# Patient Record
Sex: Male | Born: 1975 | Race: White | Hispanic: No | Marital: Married | Smoking: Never smoker
Health system: Southern US, Community
[De-identification: ages and names within clinical notes are randomized; demographics above are authoritative.]

## PROBLEM LIST (undated history)

## (undated) DIAGNOSIS — F419 Anxiety disorder, unspecified: Secondary | ICD-10-CM

## (undated) DIAGNOSIS — T7840XA Allergy, unspecified, initial encounter: Secondary | ICD-10-CM

## (undated) DIAGNOSIS — L509 Urticaria, unspecified: Secondary | ICD-10-CM

## (undated) DIAGNOSIS — R51 Headache: Secondary | ICD-10-CM

## (undated) DIAGNOSIS — T783XXA Angioneurotic edema, initial encounter: Secondary | ICD-10-CM

## (undated) DIAGNOSIS — R519 Headache, unspecified: Secondary | ICD-10-CM

## (undated) DIAGNOSIS — R000 Tachycardia, unspecified: Secondary | ICD-10-CM

## (undated) DIAGNOSIS — K219 Gastro-esophageal reflux disease without esophagitis: Secondary | ICD-10-CM

## (undated) HISTORY — DX: Urticaria, unspecified: L50.9

## (undated) HISTORY — PX: TYMPANOSTOMY TUBE PLACEMENT: SHX32

## (undated) HISTORY — PX: ADENOIDECTOMY: SUR15

## (undated) HISTORY — DX: Allergy, unspecified, initial encounter: T78.40XA

## (undated) HISTORY — PX: HERNIA REPAIR: SHX51

## (undated) HISTORY — PX: TONSILLECTOMY: SUR1361

## (undated) HISTORY — DX: Angioneurotic edema, initial encounter: T78.3XXA

---

## 1998-08-18 ENCOUNTER — Encounter: Payer: Self-pay | Admitting: Emergency Medicine

## 1998-08-18 ENCOUNTER — Emergency Department (HOSPITAL_COMMUNITY): Admission: EM | Admit: 1998-08-18 | Discharge: 1998-08-18 | Payer: Self-pay | Admitting: Emergency Medicine

## 1999-02-21 ENCOUNTER — Encounter: Payer: Self-pay | Admitting: Emergency Medicine

## 1999-02-21 ENCOUNTER — Emergency Department (HOSPITAL_COMMUNITY): Admission: EM | Admit: 1999-02-21 | Discharge: 1999-02-21 | Payer: Self-pay | Admitting: Emergency Medicine

## 2007-05-18 ENCOUNTER — Emergency Department (HOSPITAL_COMMUNITY): Admission: EM | Admit: 2007-05-18 | Discharge: 2007-05-18 | Payer: Self-pay | Admitting: Family Medicine

## 2007-06-26 ENCOUNTER — Emergency Department (HOSPITAL_COMMUNITY): Admission: EM | Admit: 2007-06-26 | Discharge: 2007-06-26 | Payer: Self-pay | Admitting: *Deleted

## 2008-07-20 ENCOUNTER — Emergency Department (HOSPITAL_COMMUNITY): Admission: EM | Admit: 2008-07-20 | Discharge: 2008-07-20 | Payer: Self-pay | Admitting: Family Medicine

## 2008-07-23 ENCOUNTER — Emergency Department: Payer: Self-pay | Admitting: Unknown Physician Specialty

## 2014-11-13 HISTORY — PX: UMBILICAL HERNIA REPAIR: SHX196

## 2017-08-12 ENCOUNTER — Emergency Department
Admission: EM | Admit: 2017-08-12 | Discharge: 2017-08-12 | Disposition: A | Discharge status: Home / Self Care | Payer: BLUE CROSS/BLUE SHIELD | Attending: Emergency Medicine | Admitting: Emergency Medicine

## 2017-08-12 DIAGNOSIS — K047 Periapical abscess without sinus: Secondary | ICD-10-CM | POA: Insufficient documentation

## 2017-08-12 DIAGNOSIS — K029 Dental caries, unspecified: Secondary | ICD-10-CM

## 2017-08-12 DIAGNOSIS — K0889 Other specified disorders of teeth and supporting structures: Secondary | ICD-10-CM | POA: Diagnosis present

## 2017-08-12 MED ORDER — AMOXICILLIN 875 MG PO TABS: 875.0000 mg | ORAL_TABLET | Freq: Two times a day (BID) | ORAL | 0 refills | Status: DC

## 2017-08-12 MED ORDER — TRAMADOL HCL 50 MG PO TABS: 50.0000 mg | ORAL_TABLET | Freq: Four times a day (QID) | ORAL | 0 refills | Status: DC | PRN

## 2017-08-12 MED ORDER — TRAMADOL HCL 50 MG PO TABS
100.0000 mg | ORAL_TABLET | Freq: Once | ORAL | Status: AC
  Administered 2017-08-12: 100 mg via ORAL
  Filled 2017-08-12: qty 2

## 2017-08-12 MED ORDER — AMOXICILLIN 500 MG PO CAPS
1000.0000 mg | ORAL_CAPSULE | Freq: Once | ORAL | Status: AC
Start: 2017-08-12 — End: 2017-08-12
  Administered 2017-08-12: 1000 mg via ORAL
  Filled 2017-08-12: qty 2

## 2017-08-12 NOTE — ED Provider Notes (Signed)
[facility]-EMERGENCY DEPARTMENT Provider Note   CSN: [id number] Arrival date & time: 08/12/17  1841     History   Chief Complaint Chief Complaint  Patient presents with  . Dental Pain    HPI Jeffrey Francis is a 41 y.o. male presents to the emergency department for evaluation of right lower jaw pain 1 week. Patient states he has a bad tooth that she sat for several years. Over the last 24 hours, pain is become severe over his right lower third molar. Patient has been taking ibuprofen, BC powders with no improvement. She denies any fevers, headaches, vision changes, difficulty swallowing, drainage, trauma or injury.  HPI  No past medical history on file.  There are no active problems to display for this patient.   No past surgical history on file.     Home Medications    Prior to Admission medications   Medication Sig Start Date End Date Taking? Authorizing Provider  amoxicillin (AMOXIL) 875 MG tablet Take 1 tablet (875 mg total) by mouth 2 (two) times daily. X 10 days 08/12/17   Evon Slack, PA-C  traMADol (ULTRAM) 50 MG tablet Take 1 tablet (50 mg total) by mouth every 6 (six) hours as needed. 08/12/17   Evon Slack, PA-C    Family History No family history on file.  Social History Social History  Substance Use Topics  . Smoking status: Not on file  . Smokeless tobacco: Not on file  . Alcohol use Not on file     Allergies   Patient has no allergy information on record.   Review of Systems Review of Systems  Constitutional: Negative for fever.  HENT: Positive for dental problem. Negative for facial swelling, sore throat and trouble swallowing.   Respiratory: Negative for shortness of breath.   Cardiovascular: Negative for chest pain.  Gastrointestinal: Negative for nausea and vomiting.  Musculoskeletal: Negative for arthralgias.  Skin: Negative for wound.  Neurological: Negative for dizziness and headaches.     Physical Exam Updated Vital  Signs BP (!) 153/101 (BP Location: Left Arm)   Pulse 81   Temp 98.7 F (37.1 C) (Oral)   Resp 14   Ht  (1.753 m)   Wt 106.6 kg (235 lb)   SpO2 99%   BMI 34.70 kg/m   Physical Exam  Constitutional: He is oriented to person, place, and time. He appears well-developed and well-nourished. No distress.  HENT:  Head: Normocephalic and atraumatic.  Right Ear: External ear normal.  Left Ear: External ear normal.  Nose: Nose normal.  Mouth/Throat: Uvula is midline and oropharynx is clear and moist. No oral lesions. No trismus in the jaw. Normal dentition. Dental caries present. No dental abscesses or uvula swelling.    Eyes: EOM are normal.  Neck: Normal range of motion. Neck supple.  Cardiovascular: Normal rate.  Exam reveals no gallop and no friction rub.   No murmur heard. Pulmonary/Chest: Effort normal and breath sounds normal. No respiratory distress.  Neurological: He is alert and oriented to person, place, and time.  Skin: Skin is warm and dry.  Psychiatric: He has a normal mood and affect. His behavior is normal. Thought content normal.     ED Treatments / Results  Labs (all labs ordered are listed, but only abnormal results are displayed) Labs Reviewed - No data to display  EKG  EKG Interpretation None       Radiology No results found.  Procedures Procedures (including critical care time)  Medications  Ordered in ED Medications  amoxicillin (AMOXIL) capsule 1,000 mg (not administered)  traMADol (ULTRAM) tablet 100 mg (not administered)     Initial Impression / Assessment and Plan / ED Course  I have reviewed the triage vital signs and the nursing notes.  Pertinent labs & imaging results that were available during my care of the patient were reviewed by me and considered in my medical decision making (see chart for details).     42 year old with right lower third molar dental pain. He is started on antibiotics and given a prescription for tramadol.  He will use Tylenol and ibuprofen as needed. He is given information on dental clinics in the area. He is educated on signs and symptoms return to the ED for.  Final Clinical Impressions(s) / ED Diagnoses   Final diagnoses:  Dental caries  Dental infection    New Prescriptions New Prescriptions   AMOXICILLIN (AMOXIL) 875 MG TABLET    Take 1 tablet (875 mg total) by mouth 2 (two) times daily. X 10 days   TRAMADOL (ULTRAM) 50 MG TABLET    Take 1 tablet (50 mg total) by mouth every 6 (six) hours as needed.     Evon Slack, PA-C 08/12/17 2027    Arnaldo Natal, MD 08/13/17 [phone]

## 2017-08-12 NOTE — Discharge Instructions (Signed)
Please take antibiotics as prescribed. Alternate Tylenol and ibuprofen for mild to moderate pain. Take tramadol as needed for severe pain. Please follow up with dental clinic. Return to the emergency department for any fevers, increasing pain, worsening symptoms or changes in health.

## 2017-08-12 NOTE — ED Triage Notes (Signed)
Patient c/o right lower jaw pain for 1 week.

## 2017-09-03 ENCOUNTER — Emergency Department
Admission: EM | Admit: 2017-09-03 | Discharge: 2017-09-03 | Disposition: A | Discharge status: Home / Self Care | Payer: BLUE CROSS/BLUE SHIELD | Attending: Emergency Medicine | Admitting: Emergency Medicine

## 2017-09-03 ENCOUNTER — Encounter: Payer: Self-pay | Admitting: Emergency Medicine

## 2017-09-03 DIAGNOSIS — K0889 Other specified disorders of teeth and supporting structures: Secondary | ICD-10-CM | POA: Diagnosis not present

## 2017-09-03 MED ORDER — TRAMADOL HCL 50 MG PO TABS: 50.0000 mg | ORAL_TABLET | Freq: Four times a day (QID) | ORAL | 0 refills | Status: DC | PRN

## 2017-09-03 MED ORDER — AMOXICILLIN 500 MG PO TABS: 500.0000 mg | ORAL_TABLET | Freq: Two times a day (BID) | ORAL | 0 refills | Status: DC

## 2017-09-03 NOTE — ED Triage Notes (Signed)
Lower right dental pain over past month. Scheduled to have removed, did have course of antibiotics during past month which relieved pain but pain returned 2 day ago.

## 2017-09-03 NOTE — ED Notes (Signed)
Pt states right sided dental pain, states he has an appt with an oral surgeon in the next few weeks but pain is too bad, states he was recently given abx for same tooth and felt better but he took all abx, awake and alert in no acute distress

## 2017-09-03 NOTE — ED Provider Notes (Signed)
[facility] Emergency Department Provider Note   ____________________________________________    I have reviewed the triage vital signs and the nursing notes.   HISTORY  Chief Complaint Dental Pain     HPI Jeffrey Francis is a 41 y.o. male who presents with dental pain. Patient reports 24 hours of right lower wisdom tooth pain, he has had problems in this area before and has oral surgery appointment scheduled for Thursday. Has had relief in the past from antibiotics. No facial swelling. No difficulty swallowing   History reviewed. No pertinent past medical history.  There are no active problems to display for this patient.   History reviewed. No pertinent surgical history.  Prior to Admission medications   Medication Sig Start Date End Date Taking? Authorizing Provider  amoxicillin (AMOXIL) 500 MG tablet Take 1 tablet (500 mg total) by mouth 2 (two) times daily. X 10 days 09/03/17   Jene Every, MD  traMADol (ULTRAM) 50 MG tablet Take 1 tablet (50 mg total) by mouth every 6 (six) hours as needed. 09/03/17 09/03/18  Jene Every, MD     Allergies Patient has no known allergies.  No family history on file.  Social History Social History  Substance Use Topics  . Smoking status: Never Smoker  . Smokeless tobacco: Not on file  . Alcohol use Not on file    Review of Systems  Constitutional: No fever/chills  ENT: No sore throat. No difficulty swallowing, dental pain as above   Gastrointestinal:   No nausea, no vomiting.     Skin: Negative for rash. Neurological: Negative for headaches     ____________________________________________   PHYSICAL EXAM:  VITAL SIGNS: ED Triage Vitals  Enc Vitals Group     BP 09/03/17 1229 (!) 128/91     Pulse Rate 09/03/17 1229 96     Resp 09/03/17 1229 20     Temp 09/03/17 1229 98.3 F (36.8 C)     Temp Source 09/03/17 1229 Oral     SpO2 09/03/17 1229 97 %     Weight 09/03/17 1230  108.9 kg (240 lb)     Height 09/03/17 1230 1.753 m (5\' 9" )     Head Circumference --      Peak Flow --      Pain Score 09/03/17 1236 6     Pain Loc --      Pain Edu? --      Excl. in GC? --     Constitutional: Alert and oriented. No acute distress. Pleasant and interactive Eyes: Conjunctivae are normal.  Head: Atraumatic. Nose: No congestion/rhinnorhea. Mouth/Throat: Mucous membranes are moist.  , Right inferior wisdom tooth with mild erythema surrounding but no drainable abscess, normal pharynx, normal tongue   Neurologic:  Normal speech and language. No gross focal neurologic deficits are appreciated.   Skin:  Skin is warm, dry and intact. No rash noted.   ____________________________________________   LABS (all labs ordered are listed, but only abnormal results are displayed)  Labs Reviewed - No data to display ____________________________________________  EKG   ____________________________________________  RADIOLOGY  None ____________________________________________   PROCEDURES  Procedure(s) performed:No    Critical Care performed: No ____________________________________________   INITIAL IMPRESSION / ASSESSMENT AND PLAN / ED COURSE  Pertinent labs & imaging results that were available during my care of the patient were reviewed by me and considered in my medical decision making (see chart for details).  Patient with dental pain as described above, will Rx  antibiotics and pain medication, outpatient follow-up as described   ____________________________________________   FINAL CLINICAL IMPRESSION(S) / ED DIAGNOSES  Final diagnoses:  Tooth pain      NEW MEDICATIONS STARTED DURING THIS VISIT:  Discharge Medication List as of 09/03/2017  1:42 PM       Note:  This document was prepared using Dragon voice recognition software and may include unintentional dictation errors.    Jene EveryKinner, Woodrow Drab, MD 09/03/17 [phone]

## 2018-03-25 ENCOUNTER — Emergency Department
Admission: EM | Admit: 2018-03-25 | Discharge: 2018-03-25 | Disposition: A | Discharge status: Home / Self Care | Payer: BLUE CROSS/BLUE SHIELD | Attending: Emergency Medicine | Admitting: Emergency Medicine

## 2018-03-25 ENCOUNTER — Other Ambulatory Visit: Payer: Self-pay

## 2018-03-25 ENCOUNTER — Encounter: Payer: Self-pay | Admitting: Emergency Medicine

## 2018-03-25 DIAGNOSIS — R531 Weakness: Secondary | ICD-10-CM | POA: Insufficient documentation

## 2018-03-25 DIAGNOSIS — M791 Myalgia, unspecified site: Secondary | ICD-10-CM | POA: Diagnosis not present

## 2018-03-25 DIAGNOSIS — R000 Tachycardia, unspecified: Secondary | ICD-10-CM

## 2018-03-25 DIAGNOSIS — Y93E1 Activity, personal bathing and showering: Secondary | ICD-10-CM | POA: Diagnosis not present

## 2018-03-25 DIAGNOSIS — Y92002 Bathroom of unspecified non-institutional (private) residence single-family (private) house as the place of occurrence of the external cause: Secondary | ICD-10-CM | POA: Insufficient documentation

## 2018-03-25 DIAGNOSIS — W57XXXA Bitten or stung by nonvenomous insect and other nonvenomous arthropods, initial encounter: Secondary | ICD-10-CM | POA: Diagnosis not present

## 2018-03-25 DIAGNOSIS — S30860A Insect bite (nonvenomous) of lower back and pelvis, initial encounter: Secondary | ICD-10-CM | POA: Insufficient documentation

## 2018-03-25 DIAGNOSIS — Y999 Unspecified external cause status: Secondary | ICD-10-CM | POA: Insufficient documentation

## 2018-03-25 LAB — CK: CK TOTAL: 79 U/L (ref 49–397)

## 2018-03-25 LAB — COMPREHENSIVE METABOLIC PANEL
ALK PHOS: 111 U/L (ref 38–126)
ALT: 37 U/L (ref 17–63)
AST: 27 U/L (ref 15–41)
Albumin: 4.5 g/dL (ref 3.5–5.0)
Anion gap: 8 (ref 5–15)
BILIRUBIN TOTAL: 1.1 mg/dL (ref 0.3–1.2)
BUN: 16 mg/dL (ref 6–20)
CALCIUM: 9 mg/dL (ref 8.9–10.3)
CHLORIDE: 98 mmol/L — AB (ref 101–111)
CO2: 29 mmol/L (ref 22–32)
CREATININE: 0.92 mg/dL (ref 0.61–1.24)
Glucose, Bld: 116 mg/dL — ABNORMAL HIGH (ref 65–99)
Potassium: 4.1 mmol/L (ref 3.5–5.1)
Sodium: 135 mmol/L (ref 135–145)
Total Protein: 7.6 g/dL (ref 6.5–8.1)

## 2018-03-25 LAB — URINALYSIS, COMPLETE (UACMP) WITH MICROSCOPIC
Bacteria, UA: NONE SEEN
Bilirubin Urine: NEGATIVE
Glucose, UA: NEGATIVE mg/dL
HGB URINE DIPSTICK: NEGATIVE
Ketones, ur: 5 mg/dL — AB
Leukocytes, UA: NEGATIVE
Nitrite: NEGATIVE
PH: 6 (ref 5.0–8.0)
Protein, ur: NEGATIVE mg/dL
SPECIFIC GRAVITY, URINE: 1.02 (ref 1.005–1.030)
Squamous Epithelial / LPF: NONE SEEN (ref 0–5)
WBC UA: NONE SEEN WBC/hpf (ref 0–5)

## 2018-03-25 LAB — CBC
HEMATOCRIT: 47.4 % (ref 40.0–52.0)
Hemoglobin: 16.2 g/dL (ref 13.0–18.0)
MCH: 30.8 pg (ref 26.0–34.0)
MCHC: 34.3 g/dL (ref 32.0–36.0)
MCV: 89.9 fL (ref 80.0–100.0)
PLATELETS: 246 10*3/uL (ref 150–440)
RBC: 5.27 MIL/uL (ref 4.40–5.90)
RDW: 13.6 % (ref 11.5–14.5)
WBC: 11.9 10*3/uL — ABNORMAL HIGH (ref 3.8–10.6)

## 2018-03-25 LAB — TSH: TSH: 0.547 u[IU]/mL (ref 0.350–4.500)

## 2018-03-25 MED ORDER — DOXYCYCLINE HYCLATE 100 MG PO TABS
100.0000 mg | ORAL_TABLET | Freq: Once | ORAL | Status: AC
  Administered 2018-03-25: 100 mg via ORAL
  Filled 2018-03-25: qty 1

## 2018-03-25 MED ORDER — ACETAMINOPHEN 500 MG PO TABS
1000.0000 mg | ORAL_TABLET | Freq: Once | ORAL | Status: AC
  Administered 2018-03-25: 1000 mg via ORAL
  Filled 2018-03-25: qty 2

## 2018-03-25 MED ORDER — SODIUM CHLORIDE 0.9 % IV BOLUS
1000.0000 mL | Freq: Once | INTRAVENOUS | Status: AC
  Administered 2018-03-25: 1000 mL via INTRAVENOUS

## 2018-03-25 MED ORDER — DOXYCYCLINE HYCLATE 50 MG PO CAPS: 100.0000 mg | ORAL_CAPSULE | Freq: Two times a day (BID) | ORAL | 0 refills | Status: AC

## 2018-03-25 MED ORDER — KETOROLAC TROMETHAMINE 30 MG/ML IJ SOLN
30.0000 mg | Freq: Once | INTRAMUSCULAR | Status: AC
Start: 2018-03-25 — End: 2018-03-25
  Administered 2018-03-25: 30 mg via INTRAVENOUS
  Filled 2018-03-25: qty 1

## 2018-03-25 NOTE — ED Notes (Signed)
Pt arrived to ED for generalized lethargy and chest pain that started this morning. Pt reports "it just feels like I haven't slept in years." Pt denies syncope or SOB. Pt reports that he pulled a possible tick or bug off his right buttocks last night while in the shower but there is no mark on his skin or pain to that area at this time. Pt reports chest pain radiates down to his umbilical hernia area and pain began today. Pt unsure if he had fever/chills. Pt wife at bedside. VSS. Call bell within reach, will continue to monitor.

## 2018-03-25 NOTE — Discharge Instructions (Addendum)
Please drink plenty of fluids to stay well-hydrated and eat small regular healthy meals throughout the day.  Get plenty of rest.  Make an appointment to establish a primary care physician for follow-up care as well as general health maintenance.  Return to the emergency department if you develop severe pain, vomiting, fever, or any other symptoms concerning to you.

## 2018-03-25 NOTE — ED Notes (Signed)
Iv fluids infusing 3 liter.  md aware.  Heart rate still increased.  Family with pt.

## 2018-03-25 NOTE — ED Notes (Signed)
Urinal at bedside. Pt aware of need for sample.  Lights dimmed for comfort

## 2018-03-25 NOTE — ED Triage Notes (Signed)
Says today started having fatigue and aching all over.  Says he thinks there was a tick on him yesterday.  Then says he has a hernia that is out at times.  Then says he has pain in chest that radiates down to hernia in mid abdomen.

## 2018-03-25 NOTE — ED Provider Notes (Addendum)
[facility] Emergency Department Provider Note  ____________________________________________  Time seen: Approximately 1:39 PM  I have reviewed the triage vital signs and the nursing notes.   HISTORY  Chief Complaint Generalized Body Aches and Insect Bite    HPI Jeffrey Francis is a 42 y.o. male, otherwise healthy, presenting with generalized body aches.  The patient reports that yesterday he was in the shower and felt a lump on his right buttock, grabbed an insect which he did not identify, and flung it against the wall but he thinks he may have been a tick or spider.  He was doing well until this morning when he woke up with generalized body aches.  He has no focality.  He denies any severe headache, fevers or chills, dysuria.  He had a single episode of nausea and vomiting this morning which has resolved and he is now drinking Gatorade without any difficulty.  He has not had any abdominal pain, constipation or diarrhea.  No travel outside the [place].  No known sick contacts.  He has not tried anything for symptoms for  History reviewed. No pertinent past medical history.  There are no active problems to display for this patient.   Past Surgical History:  Procedure Laterality Date  . HERNIA REPAIR      Current Outpatient Rx  . Order #: [id number] Class: Print  . Order #: [id number] Class: Print    Allergies Patient has no known allergies.  No family history on file.  Social History Social History   Tobacco Use  . Smoking status: Never Smoker  . Smokeless tobacco: Never Used  Substance Use Topics  . Alcohol use: Yes  . Drug use: Not on file    Review of Systems Constitutional: No fever/chills.  No lightheadedness or syncope.  Positive general malaise and generalized body aches.  Positive possible tick or spider bite. Eyes: No visual changes.  No eye discharge. ENT: No sore throat. No congestion or rhinorrhea. Cardiovascular: Denies chest  pain. Denies palpitations. Respiratory: Denies shortness of breath.  No cough. Gastrointestinal: No abdominal pain.  One episode of nausea and vomiting, now resolved.  No diarrhea.  No constipation. Genitourinary: Negative for dysuria. Musculoskeletal: Negative for back pain. Skin: Negative for rash. Neurological: Negative for headaches. No focal numbness, tingling or weakness.     ____________________________________________   PHYSICAL EXAM:  VITAL SIGNS: ED Triage Vitals  Enc Vitals Group     BP 03/25/18 1159 132/84     Pulse Rate 03/25/18 1159 (!) 122     Resp 03/25/18 1159 16     Temp 03/25/18 1159 98 F (36.7 C)     Temp Source 03/25/18 1159 Oral     SpO2 03/25/18 1159 97 %     Weight 03/25/18 1200 240 lb (108.9 kg)     Height 03/25/18 1200  (1.727 m)     Head Circumference --      Peak Flow --      Pain Score 03/25/18 1159 5     Pain Loc --      Pain Edu? --      Excl. in GC? --     Constitutional: Alert and oriented.  Answers questions appropriately.  Patient is resting comfortably in the stretcher and is able to move around without any significant distress peer Eyes: Conjunctivae are normal.  EOMI. No scleral icterus. Head: Atraumatic. Nose: No congestion/rhinnorhea. Mouth/Throat: Mucous membranes are mildly dry.  Neck: No stridor.  Supple.  No  JVD.  No meningismus. Cardiovascular: Fast rate, regular rhythm. No murmurs, rubs or gallops.  Respiratory: Normal respiratory effort.  No accessory muscle use or retractions. Lungs CTAB.  No wheezes, rales or ronchi. Gastrointestinal: Overweight.  Soft, nontender and nondistended.  No guarding or rebound.  No peritoneal signs. Musculoskeletal: No LE edema. No ttp in the calves or palpable cords.  Negative Homan's sign. Neurologic:  A&Ox3.  Speech is clear.  Face and smile are symmetric.  EOMI.  Moves all extremities well. Skin:  Skin is warm, dry and intact. No rash noted.  I have examined the area of the right  buttock where he states he thinks he was bitten by an insect, and do not note any abnormalities. Psychiatric: Mood is depressed and affect is flat.}  ____________________________________________   LABS (all labs ordered are listed, but only abnormal results are displayed)  Labs Reviewed  CBC - Abnormal; Notable for the following components:      Result Value   WBC 11.9 (*)    All other components within normal limits  COMPREHENSIVE METABOLIC PANEL - Abnormal; Notable for the following components:   Chloride 98 (*)    Glucose, Bld 116 (*)    All other components within normal limits  CK  URINALYSIS, COMPLETE (UACMP) WITH MICROSCOPIC  TSH   ____________________________________________  EKG  ED ECG REPORT I, Rockne Menghini, the attending physician, personally viewed and interpreted this ECG.   Date: 03/25/2018  EKG Time: 1208  Rate: 118  Rhythm: sinus tachycardia  Axis: normal  Intervals:none  ST&T Change: no STEMI  ____________________________________________  RADIOLOGY  No results found.  ____________________________________________   PROCEDURES  Procedure(s) performed: None  Procedures  Critical Care performed: No ____________________________________________   INITIAL IMPRESSION / ASSESSMENT AND PLAN / ED COURSE  Pertinent labs & imaging results that were available during my care of the patient were reviewed by me and considered in my medical decision making (see chart for details).  42 y.o. male, otherwise healthy, presenting with 1 day of generalized body aches and malaise without focality, in the setting of possible tick or spider bite.  Overall, the patient does have a sinus tachycardia.  He reports a history of anxiety in healthcare settings but states he is not feeling particularly anxious at this time.  I do not see any evidence of ischemia or arrhythmia.  The patient does have mildly dry mucous membranes and is receiving intravenous fluids  for possible dehydration as a cause.  In addition, his discomfort may be driving his heart rate and Tylenol and Toradol have been ordered.  In addition I have added a TSH.  Will rule out UTI.  If the patient's work-up is reassuring in the emergency department, anticipate giving the patient doxycycline for possible tickborne illness.  The patient does not have a PCP but we will give him information for establishing care at the [facility].  Plan reevaluation for final disposition.  ----------------------------------------- 3:54 PM on 03/25/2018 -----------------------------------------  The patient's work-up in the emergency department has been reassuring and he is feeling significantly better at this time.  His urinalysis does not show infection, his TSH is normal, his electrolytes are also reassuring.  His CK is within normal limits.  He has normal hemoglobin and hematocrit.  His white blood cell count is 11.9, which is nonspecific.  At this time, the patient has improvement in his tachycardia but continues to have a heart rate of about 110 so we will give him an additional liter  of fluid for continued monitoring.  I anticipate he will be able to be discharged with close PMD follow-up.  Return precautions were discussed with the patient and his wife.  ----------------------------------------- 6:12 PM on 03/25/2018 -----------------------------------------  The patient states that his symptoms have completely resolved and he is feeling better.  He has received 3 L of intravenous fluids and his heart rate has improved from 1 22-1 05.  He reports that his baseline heart rate is between 105 and 112 and that he has never had this evaluated before.  I have encouraged him to establish primary care at the [facility] clinic, both for his symptoms today as well as his elevated heart rate and routine health maintenance.  At this time, the patient is stable for discharge.  Return precautions were  discussed. ____________________________________________  FINAL CLINICAL IMPRESSION(S) / ED DIAGNOSES  Final diagnoses:  Sinus tachycardia  Myalgia  Generalized weakness         NEW MEDICATIONS STARTED DURING THIS VISIT:  New Prescriptions   No medications on file      Rockne Menghini, MD 03/25/18 1345    Rockne Menghini, MD 03/25/18 1555    Rockne Menghini, MD 03/25/18 [phone]

## 2018-03-25 NOTE — ED Notes (Signed)
Resumed care from val rn.  Pt alert.  Family at bedside.  Iv fluids infusing.

## 2018-03-25 NOTE — ED Notes (Signed)
Pt reports he is starting to feel a little better. Pt denies pain but does still feel lethargic. VSS. Will continue to monitor.

## 2018-07-05 ENCOUNTER — Other Ambulatory Visit: Payer: Self-pay

## 2018-07-05 ENCOUNTER — Emergency Department
Admission: EM | Admit: 2018-07-05 | Discharge: 2018-07-05 | Disposition: A | Discharge status: Home / Self Care | Payer: BLUE CROSS/BLUE SHIELD | Attending: Emergency Medicine | Admitting: Emergency Medicine

## 2018-07-05 ENCOUNTER — Encounter: Payer: Self-pay | Admitting: Emergency Medicine

## 2018-07-05 DIAGNOSIS — R1033 Periumbilical pain: Secondary | ICD-10-CM | POA: Insufficient documentation

## 2018-07-05 MED ORDER — OXYCODONE-ACETAMINOPHEN 5-325 MG PO TABS: 1.0000 | ORAL_TABLET | Freq: Four times a day (QID) | ORAL | 0 refills | Status: DC | PRN

## 2018-07-05 NOTE — ED Triage Notes (Signed)
Patient states "I think I have another hernia".  States has a knot to LLQ for the past few months.  Had some pain yesterday, but today denies complaint.    Patient is AAOx3.  Skin warm and dry. NAD

## 2018-07-05 NOTE — ED Provider Notes (Signed)
[facility] Emergency Department Provider Note       Time seen: ----------------------------------------- 1:25 PM on 07/05/2018 -----------------------------------------   I have reviewed the triage vital signs and the nursing notes.  HISTORY   Chief Complaint Hernia    HPI Jeffrey Francis is a 42 y.o. male with a history of hernia repair who presents to the ED for possible hernia.  [place]  He does a fair amount of lifting on his job and felt some discomfort in his periumbilical area.  He denies any fevers, chills, vomiting area when he is at rest the pain seems to resolve.  History reviewed. No pertinent past medical history.  There are no active problems to display for this patient.   Past Surgical History:  Procedure Laterality Date  . HERNIA REPAIR      Allergies Patient has no known allergies.  Social History Social History   Tobacco Use  . Smoking status: Never Smoker  . Smokeless tobacco: Never Used  Substance Use Topics  . Alcohol use: Yes  . Drug use: Not on file   Review of Systems Constitutional: Negative for fever. Cardiovascular: Negative for chest pain. Respiratory: Negative for shortness of breath. Gastrointestinal: Positive for intermittent abdominal pain Musculoskeletal: Negative for back pain. Skin: Negative for rash. Neurological: Negative for headaches, focal weakness or numbness.  All systems negative/normal/unremarkable except as stated in the HPI  ____________________________________________   PHYSICAL EXAM:  VITAL SIGNS: ED Triage Vitals  Enc Vitals Group     BP 07/05/18 1258 (!) 151/90     Pulse Rate 07/05/18 1258 96     Resp 07/05/18 1258 20     Temp 07/05/18 1258 97.7 F (36.5 C)     Temp Source 07/05/18 1258 Oral     SpO2 07/05/18 1258 98 %     Weight 07/05/18 1259 240 lb (108.9 kg)     Height 07/05/18 1259 5\' 9"  (1.753 m)     Head Circumference  --      Peak Flow --      Pain Score 07/05/18 1303 0     Pain Loc --      Pain Edu? --      Excl. in GC? --    Constitutional: Alert and oriented. Well appearing and in no distress. Gastrointestinal: Previous periumbilical surgical scar.  No specific tenderness at this time, no bulge or herniation. Musculoskeletal: Nontender with normal range of motion in extremities. No lower extremity tenderness nor edema. Neurologic:  Normal speech and language. No gross focal neurologic deficits are appreciated.  Skin:  Skin is warm, dry and intact. No rash noted. Psychiatric: Mood and affect are normal. Speech and behavior are normal.  ____________________________________________  ED COURSE:  As part of my medical decision making, I reviewed the following data within the electronic MEDICAL RECORD NUMBER History obtained from family if available, nursing notes, old chart and ekg, as well as notes from prior ED visits. Patient presented for pain around his periumbilical hernia repair site, clinically he does not currently have herniation   Procedures ____________________________________________  DIFFERENTIAL DIAGNOSIS   Umbilical hernia, scar tissue  FINAL ASSESSMENT AND PLAN  Umbilical pain   Plan: The patient had presented for umbilical pain at a recent umbilical surgical site.  Possibly has affected his previous surgical site and I am concerned about the integrity of the mesh from his previous surgery but the pain is not persistent, there is  not obvious herniation now and he can follow-up with general surgery as an outpatient.   Ulice DashJohnathan E Williams, MD   Note: This note was generated in part or whole with voice recognition software. Voice recognition is usually quite accurate but there are transcription errors that can and very often do occur. I apologize for any typographical errors that were not detected and corrected.     Emily FilbertWilliams, Jonathan E, MD 07/05/18 1341

## 2018-07-05 NOTE — ED Notes (Signed)
See triage note  Presents with possible hernia  States he has felt a limp to abd  Over the past few months  Developed pain again yesterday  No swelling noted at present

## 2018-07-29 ENCOUNTER — Encounter: Payer: Self-pay | Admitting: Surgery

## 2018-07-29 ENCOUNTER — Ambulatory Visit (INDEPENDENT_AMBULATORY_CARE_PROVIDER_SITE_OTHER): Payer: BLUE CROSS/BLUE SHIELD | Admitting: Surgery

## 2018-07-29 VITALS — Ht 69.0 in | Wt 248.0 lb

## 2018-07-29 DIAGNOSIS — R1013 Epigastric pain: Secondary | ICD-10-CM | POA: Diagnosis not present

## 2018-07-29 NOTE — Patient Instructions (Signed)
We have scheduled you for a CT Scan of your Abdomen and Pelvis. This has been scheduled at 08/06/2018 at our [place] location. Please Check-in at 12:45 PM, 15 minutes prior to your scheduled appointment. If you need to reschedule your Scan, you may do so by calling [phone].  You will need to pick up a prep kit at least 24 hours in advance of your Scan: You may pick this up at the [place] department at [place] [facility], or [employer].  Bring a list of medications with you to your appointment and you may have nothing to eat or drink 4 hours prior to your CT Scan.

## 2018-07-31 ENCOUNTER — Encounter: Payer: Self-pay | Admitting: Surgery

## 2018-07-31 NOTE — Progress Notes (Signed)
Patient ID: Jeffrey Francis, male   DOB: 1976/11/10, 42 y.o.   MRN: [id number]  HPI Jeffrey Francis is a 42 y.o. male in consultation at the request of Dr. Mayford Knife for abdominal pain.  He reports that he started having abdominal pain for the last several weeks around the previous scar where she had an umbilical hernia repair.  Apparently this was performed in [place] and the patient or the family is unsure whether or not they used mesh.  Apparently the family stated that initially surgery was going to be laparoscopically and then ended up being more complex and required an open technique.  He describes the pain is intermittent sharp and similar to the pain that he experienced years ago when he had his first hernia.  Labs reviewed including a CBC and a CMP that were completely normal.  He is obese but he is able to perform more than 4 METS of activity without any shortness of breath or chest pain.  He seems to have increasing pain after he eats.  No nausea no vomiting no other symptoms  HPI  History reviewed. No pertinent past medical history.  Past Surgical History:  Procedure Laterality Date  . HERNIA REPAIR    . UMBILICAL HERNIA REPAIR  2016    History reviewed. No pertinent family history.  Social History Social History   Tobacco Use  . Smoking status: Never Smoker  . Smokeless tobacco: Never Used  Substance Use Topics  . Alcohol use: Yes  . Drug use: Never    No Known Allergies  Current Outpatient Medications  Medication Sig Dispense Refill  . Ascorbic Acid (VITAMIN C) 1000 MG tablet Take 1,000 mg by mouth daily.    Marland Kitchen loratadine (CLARITIN) 10 MG tablet Take 10 mg by mouth daily.    . Multiple Vitamins-Minerals (MULTIVITAMIN MEN PO) Take 1 tablet by mouth 1 day or 1 dose.     No current facility-administered medications for this visit.      Review of Systems Full ROS  was asked and was negative except for the information on the HPI  Physical Exam Height 5\' 9"  (1.753 m),  weight 248 lb (112.5 kg). CONSTITUTIONAL: NAD EYES: Pupils are equal, round, and reactive to light, Sclera are non-icteric. EARS, NOSE, MOUTH AND THROAT: The oropharynx is clear. The oral mucosa is pink and moist. Hearing is intact to voice. LYMPH NODES:  Lymph nodes in the neck are normal. RESPIRATORY:  Lungs are clear. There is normal respiratory effort, with equal breath sounds bilaterally, and without pathologic use of accessory muscles. CARDIOVASCULAR: Heart is regular without murmurs, gallops, or rubs. GI: The abdomen is soft, tender to palpation around the umbilical area.  I do not see any definitive hernia defects.. There are no palpable masses. There is no hepatosplenomegaly. There are normal bowel sounds in all quadrants. GU: Rectal deferred.   MUSCULOSKELETAL: Normal muscle strength and tone. No cyanosis or edema.   SKIN: Turgor is good and there are no pathologic skin lesions or ulcers. NEUROLOGIC: Motor and sensation is grossly normal. Cranial nerves are grossly intact. PSYCH:  Oriented to person, place and time. Affect is normal.  Data Reviewed  I have personally reviewed the patient's imaging, laboratory findings and medical records.    Assessment/Plan Abdominal pain in a patient with a previous umbilical hernia repair.  On physical exam I cannot palpate a true defect.  I am concerned about potentially having mesh issues or the pain being neuropathic from nerve irritation.  I will obtain a CT scan of the abdomen and pelvis to further evaluate his abdominal wall.  I will see him back in a couple weeks after the result is done.  No need for any urgent surgical intervention at this time. A copy of this report will be sent to the referring provider   Sterling Bigiego Pabon, MD FACS General Surgeon 07/31/2018, 9:12 AM

## 2018-08-05 ENCOUNTER — Telehealth: Payer: Self-pay | Admitting: Surgery

## 2018-08-05 NOTE — Telephone Encounter (Signed)
I have spoke with patient. I have advised patient to call his insurance to find out if [facility] in[facility]. When speaking with the rep she stated that all claims are proceed with our local [employer]. This would be based on if [facility] is in-network with the [employer]. [facility] is in-network with the local [employer].

## 2018-08-05 NOTE — Telephone Encounter (Signed)
Patient's spouse Britta Mccreedy(Barbara [phone]) called & was checking on the CT Abd with contrast scheduled for 08-06-18 @ 1:00. They are wanting to see if it is in  or out of network. Please call and advise.

## 2018-08-06 ENCOUNTER — Ambulatory Visit
Admission: RE | Admit: 2018-08-06 | Discharge: 2018-08-06 | Disposition: A | Discharge status: Home / Self Care | Payer: BLUE CROSS/BLUE SHIELD | Source: Ambulatory Visit | Attending: Surgery | Admitting: Surgery

## 2018-08-06 DIAGNOSIS — K8020 Calculus of gallbladder without cholecystitis without obstruction: Secondary | ICD-10-CM | POA: Insufficient documentation

## 2018-08-06 DIAGNOSIS — R1013 Epigastric pain: Secondary | ICD-10-CM | POA: Diagnosis present

## 2018-08-06 DIAGNOSIS — K439 Ventral hernia without obstruction or gangrene: Secondary | ICD-10-CM | POA: Diagnosis not present

## 2018-08-06 MED ORDER — IOPAMIDOL (ISOVUE-300) INJECTION 61%
100.0000 mL | Freq: Once | INTRAVENOUS | Status: AC | PRN
  Administered 2018-08-06: 100 mL via INTRAVENOUS

## 2018-08-07 ENCOUNTER — Ambulatory Visit (INDEPENDENT_AMBULATORY_CARE_PROVIDER_SITE_OTHER): Payer: BLUE CROSS/BLUE SHIELD | Admitting: Surgery

## 2018-08-07 ENCOUNTER — Encounter: Payer: Self-pay | Admitting: Surgery

## 2018-08-07 ENCOUNTER — Encounter: Payer: Self-pay | Admitting: *Deleted

## 2018-08-07 VITALS — BP 128/78 | HR 100 | Temp 97.7°F | Resp 14 | Ht 68.0 in | Wt 249.0 lb

## 2018-08-07 DIAGNOSIS — K432 Incisional hernia without obstruction or gangrene: Secondary | ICD-10-CM | POA: Diagnosis not present

## 2018-08-07 NOTE — H&P (View-Only) (Signed)
Outpatient Surgical Follow Up  08/07/2018  Jeffrey Francis is an 42 y.o. male.   Chief Complaint  Patient presents with  . Follow-up    ventral hernia and review CT scan results from 08-06-18    HPI: Jeffrey Francis is following up for abdominal pain and questionable hernia.  CT scan personally reviewed showing evidence of a recurrent ventral hernia.  There is some evidence of a small bowel.  There is no evidence of incarceration or strangulation.  There is no evidence of any other acute abnormalities.  There is no evidence of loss of domain. He needs to have intermittent pains around his mid abdomen that are sharp. He is able to perform more than 4 METS w/o SOB or c/p.  Past Medical History:  Diagnosis Date  . Allergy    sinus problems    Past Surgical History:  Procedure Laterality Date  . HERNIA REPAIR    . UMBILICAL HERNIA REPAIR  2016    History reviewed. No pertinent family history.  Social History:  reports that he has never smoked. He has never used smokeless tobacco. He reports that he drinks alcohol. He reports that he does not use drugs.  Allergies: No Known Allergies  Medications reviewed.    ROS Full ROS performed and is otherwise negative other than what is stated in HPI   BP 128/78   Pulse 100   Temp 97.7 F (36.5 C) (Skin)   Resp 14   Ht 5\' 8"  (1.727 m)   Wt 249 lb (112.9 kg)   SpO2 98%   BMI 37.86 kg/m   Physical Exam  Constitutional: He is oriented to person, place, and time. He appears well-developed and well-nourished. No distress.  HENT:  Head: Normocephalic.  Neck: Normal range of motion. Neck supple. No tracheal deviation present. No thyromegaly present.  Cardiovascular: Normal rate and regular rhythm.  No murmur heard. Pulmonary/Chest: Effort normal and breath sounds normal. No stridor. No respiratory distress.  Abdominal: Soft. Bowel sounds are normal. He exhibits no distension and no mass. There is no tenderness. There is no rebound and no  guarding. A hernia is present.  TTP , no peritonitis  Musculoskeletal: Normal range of motion. He exhibits no edema or deformity.  Neurological: He is alert and oriented to person, place, and time.  Skin: Skin is warm and dry. He is not diaphoretic.  Psychiatric: He has a normal mood and affect. His behavior is normal. Judgment and thought content normal.  Nursing note and vitals reviewed.      No results found for this or any previous visit (from the past 48 hour(s)). Ct Abdomen W Contrast  Result Date: 08/06/2018 CLINICAL DATA:  History of hernia repair. Epigastric abdominal pain. Palpable abnormality on left-sided abdomen for several months. EXAM: CT ABDOMEN WITH CONTRAST TECHNIQUE: Multidetector CT imaging of the abdomen was performed using the standard protocol following bolus administration of intravenous contrast. CONTRAST:  ISOVUE-300 IOPAMIDOL (ISOVUE-300) INJECTION 61% COMPARISON:  None. FINDINGS: Lower chest: Clear lung bases. Normal heart size without pericardial or pleural effusion. Hepatobiliary: Normal liver. Stone in the gallbladder neck measures 1.5 cm. No evidence of acute cholecystitis or biliary duct dilatation. Pancreas: Normal, without mass or ductal dilatation. Spleen: Normal in size, without focal abnormality. Adrenals/Urinary Tract: Normal adrenal glands. Normal kidneys, without hydronephrosis. Stomach/Bowel: Normal stomach, without wall thickening. Normal colon and terminal ileum. Small bowel is positioned within a lower abdominal ventral hernia including on 65/2. No findings to suggest obstruction or strangulation. Vascular/Lymphatic:  Normal caliber of the aorta and branch vessels. No retroperitoneal or retrocrural adenopathy. Other: No ascites.  No free intraperitoneal air. Musculoskeletal: Transitional S1 vertebral body. Moderate disc bulge at L3-4. IMPRESSION: 1. Ventral lower abdominal wall hernia containing fat and nonobstructive small bowel. 2. Cholelithiasis.  Electronically Signed   By: Jeronimo Greaves M.D.   On: 08/06/2018 16:22    Assessment/Plan: ReCurrent and symptomatic ventral hernia on a patient that is overweight.  Discussed with the patient in detail about his disease process and given his symptoms I definitely recommend repair.  I do think he is a good candidate for robotic repair.  Discussed with the patient detail about the procedure.  Risk benefit and possible complications including but not limited to: Bleeding, infection, recurrence, bowel injury, the need for further procedures and conversion to open.  He understands and wishes to proceed  Sterling Big, MD [facility] General Surgeon

## 2018-08-07 NOTE — Progress Notes (Signed)
Outpatient Surgical Follow Up  08/07/2018  Jeffrey Francis is an 41 y.o. male.   Chief Complaint  Patient presents with  . Follow-up    ventral hernia and review CT scan results from 08-06-18    HPI: Jeffrey Francis is following up for abdominal pain and questionable hernia.  CT scan personally reviewed showing evidence of a recurrent ventral hernia.  There is some evidence of a small bowel.  There is no evidence of incarceration or strangulation.  There is no evidence of any other acute abnormalities.  There is no evidence of loss of domain. He needs to have intermittent pains around his mid abdomen that are sharp. He is able to perform more than 4 METS w/o SOB or c/p.  Past Medical History:  Diagnosis Date  . Allergy    sinus problems    Past Surgical History:  Procedure Laterality Date  . HERNIA REPAIR    . UMBILICAL HERNIA REPAIR  2016    History reviewed. No pertinent family history.  Social History:  reports that he has never smoked. He has never used smokeless tobacco. He reports that he drinks alcohol. He reports that he does not use drugs.  Allergies: No Known Allergies  Medications reviewed.    ROS Full ROS performed and is otherwise negative other than what is stated in HPI   BP 128/78   Pulse 100   Temp 97.7 F (36.5 C) (Skin)   Resp 14   Ht 5' 8" (1.727 m)   Wt 249 lb (112.9 kg)   SpO2 98%   BMI 37.86 kg/m   Physical Exam  Constitutional: He is oriented to person, place, and time. He appears well-developed and well-nourished. No distress.  HENT:  Head: Normocephalic.  Neck: Normal range of motion. Neck supple. No tracheal deviation present. No thyromegaly present.  Cardiovascular: Normal rate and regular rhythm.  No murmur heard. Pulmonary/Chest: Effort normal and breath sounds normal. No stridor. No respiratory distress.  Abdominal: Soft. Bowel sounds are normal. He exhibits no distension and no mass. There is no tenderness. There is no rebound and no  guarding. A hernia is present.  TTP , no peritonitis  Musculoskeletal: Normal range of motion. He exhibits no edema or deformity.  Neurological: He is alert and oriented to person, place, and time.  Skin: Skin is warm and dry. He is not diaphoretic.  Psychiatric: He has a normal mood and affect. His behavior is normal. Judgment and thought content normal.  Nursing note and vitals reviewed.      No results found for this or any previous visit (from the past 48 hour(s)). Ct Abdomen W Contrast  Result Date: 08/06/2018 CLINICAL DATA:  History of hernia repair. Epigastric abdominal pain. Palpable abnormality on left-sided abdomen for several months. EXAM: CT ABDOMEN WITH CONTRAST TECHNIQUE: Multidetector CT imaging of the abdomen was performed using the standard protocol following bolus administration of intravenous contrast. CONTRAST:  100mL ISOVUE-300 IOPAMIDOL (ISOVUE-300) INJECTION 61% COMPARISON:  None. FINDINGS: Lower chest: Clear lung bases. Normal heart size without pericardial or pleural effusion. Hepatobiliary: Normal liver. Stone in the gallbladder neck measures 1.5 cm. No evidence of acute cholecystitis or biliary duct dilatation. Pancreas: Normal, without mass or ductal dilatation. Spleen: Normal in size, without focal abnormality. Adrenals/Urinary Tract: Normal adrenal glands. Normal kidneys, without hydronephrosis. Stomach/Bowel: Normal stomach, without wall thickening. Normal colon and terminal ileum. Small bowel is positioned within a lower abdominal ventral hernia including on 65/2. No findings to suggest obstruction or strangulation. Vascular/Lymphatic:   Normal caliber of the aorta and branch vessels. No retroperitoneal or retrocrural adenopathy. Other: No ascites.  No free intraperitoneal air. Musculoskeletal: Transitional S1 vertebral body. Moderate disc bulge at L3-4. IMPRESSION: 1. Ventral lower abdominal wall hernia containing fat and nonobstructive small bowel. 2. Cholelithiasis.  Electronically Signed   By: Kyle  Talbot M.D.   On: 08/06/2018 16:22    Assessment/Plan: ReCurrent and symptomatic ventral hernia on a patient that is overweight.  Discussed with the patient in detail about his disease process and given his symptoms I definitely recommend repair.  I do think he is a good candidate for robotic repair.  Discussed with the patient detail about the procedure.  Risk benefit and possible complications including but not limited to: Bleeding, infection, recurrence, bowel injury, the need for further procedures and conversion to open.  He understands and wishes to proceed  Jeffrey Pabon, MD FACS General Surgeon 

## 2018-08-07 NOTE — Progress Notes (Signed)
Patient's surgery has been scheduled for 08-13-18 at [facility] with Dr. Everlene Farrier.   The patient was instructed to call the office should he have further questions.

## 2018-08-07 NOTE — Patient Instructions (Addendum)
The patient is aware to call back for any questions or new concerns. Plan out of work 2 weeks and light duty for 2 additional weeks  Hernia, Adult A hernia is the bulging of an organ or tissue through a weak spot in the muscles of the abdomen (abdominal wall). Hernias develop most often near the navel or groin. There are many kinds of hernias. Common kinds include:  Femoral hernia. This kind of hernia develops under the groin in the upper thigh area.  Inguinal hernia. This kind of hernia develops in the groin or scrotum.  Umbilical hernia. This kind of hernia develops near the navel.  Hiatal hernia. This kind of hernia causes part of the stomach to be pushed up into the chest.  Incisional hernia. This kind of hernia bulges through a scar from an abdominal surgery.  What are the causes? This condition may be caused by:  Heavy lifting.  Coughing over a long period of time.  Straining to have a bowel movement.  An incision made during an abdominal surgery.  A birth defect (congenital defect).  Excess weight or obesity.  Smoking.  Poor nutrition.  Cystic fibrosis.  Excess fluid in the abdomen.  Undescended testicles.  What are the signs or symptoms? Symptoms of a hernia include:  A lump on the abdomen. This is the first sign of a hernia. The lump may become more obvious with standing, straining, or coughing. It may get bigger over time if it is not treated or if the condition causing it is not treated.  Pain. A hernia is usually painless, but it may become painful over time if treatment is delayed. The pain is usually dull and may get worse with standing or lifting heavy objects.  Sometimes a hernia gets tightly squeezed in the weak spot (strangulated) or stuck there (incarcerated) and causes additional symptoms. These symptoms may include:  Vomiting.  Nausea.  Constipation.  Irritability.  How is this diagnosed? A hernia may be diagnosed with:  A physical  exam. During the exam your health care provider may ask you to cough or to make a specific movement, because a hernia is usually more visible when you move.  Imaging tests. These can include: ? X-rays. ? Ultrasound. ? CT scan.  How is this treated? A hernia that is small and painless may not need to be treated. A hernia that is large or painful may be treated with surgery. Inguinal hernias may be treated with surgery to prevent incarceration or strangulation. Strangulated hernias are always treated with surgery, because lack of blood to the trapped organ or tissue can cause it to die. Surgery to treat a hernia involves pushing the bulge back into place and repairing the weak part of the abdomen. Follow these instructions at home:  Avoid straining.  Do not lift anything heavier than 10 lb (4.5 kg).  Lift with your leg muscles, not your back muscles. This helps avoid strain.  When coughing, try to cough gently.  Prevent constipation. Constipation leads to straining with bowel movements, which can make a hernia worse or cause a hernia repair to break down. You can prevent constipation by: ? Eating a high-fiber diet that includes plenty of fruits and vegetables. ? Drinking enough fluids to keep your urine clear or pale yellow. Aim to drink 6-8 glasses of water per day. ? Using a stool softener as directed by your health care provider.  Lose weight, if you are overweight.  Do not use any tobacco products, including  cigarettes, chewing tobacco, or electronic cigarettes. If you need help quitting, ask your health care provider.  Keep all follow-up visits as directed by your health care provider. This is important. Your health care provider may need to monitor your condition. Contact a health care provider if:  You have swelling, redness, and pain in the affected area.  Your bowel habits change. Get help right away if:  You have a fever.  You have abdominal pain that is getting  worse.  You feel nauseous or you vomit.  You cannot push the hernia back in place by gently pressing on it while you are lying down.  The hernia: ? Changes in shape or size. ? Is stuck outside the abdomen. ? Becomes discolored. ? Feels hard or tender. This information is not intended to replace advice given to you by your health care provider. Make sure you discuss any questions you have with your health care provider. Document Released: 10/30/2005 Document Revised: 03/29/2016 Document Reviewed: 09/09/2014 Elsevier Interactive Patient Education  2017 [employer].   We have seen you today in our office for your Ventral (Abdominal) Hernia. We recommend that you begin using the abdominal binder given to you today for comfort.  If the Hernia comes out, lie down and push the protruding part back in if possible. It is much easier to do this while lying down.  Please review the instructions below. If you develop any signs or symptoms of a strangulated hernia, please go to the Emergency Room Immediately. These symptoms are highlighted below under, "GET HELP RIGHT AWAY IF:"  If you have any questions or concerns, please call our office and speak with a nurse.    Ventral Hernia A ventral hernia is a bulge of tissue from inside the abdomen that pushes through a weak area of the muscles that form the front wall of the abdomen. The tissues inside the abdomen are inside a sac (peritoneum). These tissues include the small intestine, large intestine, and the fatty tissue that covers the intestines (omentum). Sometimes, the bulge that forms a hernia contains intestines. Other hernias contain only fat. Ventral hernias do not go away without surgical treatment. There are several types of ventral hernias. You may have:  A hernia at an incision site from previous abdominal surgery (incisional hernia).  A hernia just above the belly button (epigastric hernia), or at the belly button (umbilical hernia).  These types of hernias can develop from heavy lifting or straining.  A hernia that comes and goes (reducible hernia). It may be visible only when you lift or strain. This type of hernia can be pushed back into the abdomen (reduced).  A hernia that traps abdominal tissue inside the hernia (incarcerated hernia). This type of hernia does not reduce.  A hernia that cuts off blood flow to the tissues inside the hernia (strangulated hernia). The tissues can start to die if this happens. This is a very painful bulge that cannot be reduced. A strangulated hernia is a medical emergency.  What are the causes? This condition is caused by abdominal tissue putting pressure on an area of weakness in the abdominal muscles. What increases the risk? The following factors may make you more likely to develop this condition:  Being male.  Being 80 or older.  Being overweight or obese.  Having had previous abdominal surgery, especially if there was an infection after surgery.  Having had an injury to the abdominal wall.  Having had several pregnancies.  Having a buildup of  fluid inside the abdomen (ascites).  What are the signs or symptoms? The only symptom of a ventral hernia may be a painless bulge in the abdomen. A reducible hernia may be visible only when you strain, cough, or lift. Other symptoms may include:  Dull pain.  A feeling of pressure.  Signs and symptoms of a strangulated hernia may include:  Increasing pain.  Nausea and vomiting.  Pain when pressing on the hernia.  The skin over the hernia turning red or purple.  Constipation.  Blood in the stool (feces).  How is this diagnosed? This condition may be diagnosed based on:  Your symptoms.  Your medical history.  A physical exam. You may be asked to cough or strain while standing. These actions increase the pressure inside your abdomen and force the hernia through the opening in your muscles. Your health care provider  may try to reduce the hernia by pressing on it.  Imaging studies, such as an ultrasound or CT scan.  How is this treated? This condition is treated with surgery. If you have a strangulated hernia, surgery is done as soon as possible. If your hernia is small and not incarcerated, you may be asked to lose some weight before surgery. Follow these instructions at home:  Follow instructions from your health care provider about eating or drinking restrictions.  If you are overweight, your health care provider may recommend that you increase your activity level and eat a healthier diet.  Do not lift anything that is heavier than 10 lb (4.5 kg).  Return to your normal activities as told by your health care provider. Ask your health care provider what activities are safe for you. You may need to avoid activities that increase pressure on your hernia.  Take over-the-counter and prescription medicines only as told by your health care provider.  Keep all follow-up visits as told by your health care provider. This is important. Contact a health care provider if:  Your hernia gets larger.  Your hernia becomes painful. Get help right away if:  Your hernia becomes increasingly painful.  You have pain along with any of the following: ? Changes in skin color in the area of the hernia. ? Nausea. ? Vomiting. ? Fever. Summary  A ventral hernia is a bulge of tissue from inside the abdomen that pushes through a weak area of the muscles that form the front wall of the abdomen.  This condition is treated with surgery, which may be urgent depending on your hernia.  Do not lift anything that is heavier than 10 lb (4.5 kg), and follow activity instructions from your health care provider. This information is not intended to replace advice given to you by your health care provider. Make sure you discuss any questions you have with your health care provider. Document Released: 10/16/2012 Document Revised:  06/16/2016 Document Reviewed: 06/16/2016 Elsevier Interactive Patient Education  [employer].   We have seen you today in our office for your Ventral (Abdominal) Hernia. We recommend that you begin using the abdominal binder given to you today for comfort.  If the Hernia comes out, lie down and push the protruding part back in if possible. It is much easier to do this while lying down.  Please review the instructions below. If you develop any signs or symptoms of a strangulated hernia, please go to the Emergency Room Immediately. These symptoms are highlighted below under, "GET HELP RIGHT AWAY IF:"  If you have any questions or concerns, please  call our office and speak with a nurse.

## 2018-08-09 ENCOUNTER — Other Ambulatory Visit: Payer: Self-pay

## 2018-08-09 ENCOUNTER — Encounter
Admission: RE | Admit: 2018-08-09 | Discharge: 2018-08-09 | Disposition: A | Discharge status: Home / Self Care | Payer: BLUE CROSS/BLUE SHIELD | Source: Ambulatory Visit | Attending: Surgery | Admitting: Surgery

## 2018-08-09 HISTORY — DX: Gastro-esophageal reflux disease without esophagitis: K21.9

## 2018-08-09 HISTORY — DX: Anxiety disorder, unspecified: F41.9

## 2018-08-09 HISTORY — DX: Headache, unspecified: R51.9

## 2018-08-09 HISTORY — DX: Tachycardia, unspecified: R00.0

## 2018-08-09 HISTORY — DX: Headache: R51

## 2018-08-09 NOTE — Patient Instructions (Signed)
Your procedure is scheduled on: 08-13-18 TUESDAY Report to Same Day Surgery 2nd floor medical mall [facility] Entrance-take elevator on left to 2nd floor.  Check in with surgery information desk.) To find out your arrival time please call [phone] between 1PM - 3PM on 08-12-18 MONDAY  Remember: Instructions that are not followed completely may result in serious medical risk, up to and including death, or upon the discretion of your surgeon and anesthesiologist your surgery may need to be rescheduled.    _x___ 1. Do not eat food after midnight the night before your procedure. NO GUM OR CANDY AFTER MIDNIGHT.  You may drink clear liquids up to 2 hours before you are scheduled to arrive at the hospital for your procedure.  Do not drink clear liquids within 2 hours of your scheduled arrival to the hospital.  Clear liquids include  --Water or Apple juice without pulp  --Clear carbohydrate beverage such as ClearFast or Gatorade  --Black Coffee or Clear Tea (No milk, no creamers, do not add anything to the coffee or Tea   ____Ensure clear carbohydrate drink on the way to the hospital for bariatric patients  ____Ensure clear carbohydrate drink 3 hours before surgery for Dr Rutherford Nail patients if physician instructed.    __x__ 2. No Alcohol for 24 hours before or after surgery.   __x__3. No Smoking or e-cigarettes for 24 prior to surgery.  Do not use any chewable tobacco products for at least 6 hour prior to surgery   ____  4. Bring all medications with you on the day of surgery if instructed.    __x__ 5. Notify your doctor if there is any change in your medical condition     (cold, fever, infections).    x___6. On the morning of surgery brush your teeth with toothpaste and water.  You may rinse your mouth with mouth wash if you wish.  Do not swallow any toothpaste or mouthwash.   Do not wear jewelry, make-up, hairpins, clips or nail polish.  Do not wear lotions, powders, or perfumes. You  may wear deodorant.  Do not shave 48 hours prior to surgery. Men may shave face and neck.  Do not bring valuables to the hospital.    [facility] is not responsible for any belongings or valuables.               Contacts, dentures or bridgework may not be worn into surgery.  Leave your suitcase in the car. After surgery it may be brought to your room.  For patients admitted to the hospital, discharge time is determined by your treatment team.  _  Patients discharged the day of surgery will not be allowed to drive home.  You will need someone to drive you home and stay with you the night of your procedure.    Please read over the following fact sheets that you were given:   [facility] Preparing for Surgery   ____ Take anti-hypertensive listed below, cardiac, seizure, asthma, anti-reflux and psychiatric medicines. These include:  1. NONE  2.  3.  4.  5.  6.  ____Fleets enema or Magnesium Citrate as directed.   _x___ Use CHG Soap or sage wipes as directed on instruction sheet   ____ Use inhalers on the day of surgery and bring to hospital day of surgery  ____ Stop Metformin and Janumet 2 days prior to surgery.    ____ Take 1/2 of usual insulin dose the night before surgery and none on  the morning surgery.   ____ Follow recommendations from Cardiologist, Pulmonologist or PCP regarding stopping Aspirin, Coumadin, Plavix ,Eliquis, Effient, or Pradaxa, and Pletal.  X____Stop Anti-inflammatories such as Advil, Aleve, Ibuprofen, Motrin, Naproxen, Naprosyn, Goodies powders or aspirin products NOW-OK to take Tylenol   ____ Stop supplements until after surgery.     ____ Bring C-Pap to the hospital.

## 2018-08-12 ENCOUNTER — Encounter
Admission: RE | Admit: 2018-08-12 | Discharge: 2018-08-12 | Disposition: A | Discharge status: Home / Self Care | Payer: BLUE CROSS/BLUE SHIELD | Source: Ambulatory Visit | Attending: Surgery | Admitting: Surgery

## 2018-08-12 DIAGNOSIS — Z01810 Encounter for preprocedural cardiovascular examination: Secondary | ICD-10-CM | POA: Diagnosis present

## 2018-08-12 DIAGNOSIS — R000 Tachycardia, unspecified: Secondary | ICD-10-CM | POA: Insufficient documentation

## 2018-08-13 ENCOUNTER — Other Ambulatory Visit: Payer: Self-pay

## 2018-08-13 ENCOUNTER — Encounter
Admission: RE | Disposition: A | Discharge status: Home / Self Care | Payer: Self-pay | Source: Ambulatory Visit | Attending: Surgery

## 2018-08-13 ENCOUNTER — Ambulatory Visit: Payer: BLUE CROSS/BLUE SHIELD | Admitting: Anesthesiology

## 2018-08-13 ENCOUNTER — Ambulatory Visit
Admission: RE | Admit: 2018-08-13 | Discharge: 2018-08-13 | Disposition: A | Discharge status: Home / Self Care | Payer: BLUE CROSS/BLUE SHIELD | Source: Ambulatory Visit | Attending: Surgery | Admitting: Surgery

## 2018-08-13 DIAGNOSIS — K432 Incisional hernia without obstruction or gangrene: Secondary | ICD-10-CM

## 2018-08-13 DIAGNOSIS — Z6837 Body mass index (BMI) 37.0-37.9, adult: Secondary | ICD-10-CM | POA: Diagnosis not present

## 2018-08-13 DIAGNOSIS — E663 Overweight: Secondary | ICD-10-CM | POA: Diagnosis not present

## 2018-08-13 HISTORY — PX: ROBOTIC ASSISTED LAPAROSCOPIC VENTRAL/INCISIONAL HERNIA REPAIR: SHX6607

## 2018-08-13 SURGERY — ROBOTIC ASSISTED LAPAROSCOPIC VENTRAL/INCISIONAL HERNIA REPAIR
Anesthesia: General

## 2018-08-13 MED ORDER — CELECOXIB 200 MG PO CAPS
ORAL_CAPSULE | ORAL | Status: AC
  Filled 2018-08-13: qty 1

## 2018-08-13 MED ORDER — FENTANYL CITRATE (PF) 100 MCG/2ML IJ SOLN
INTRAMUSCULAR | Status: DC | PRN
  Administered 2018-08-13: 50 ug via INTRAVENOUS
  Administered 2018-08-13: 25 ug via INTRAVENOUS
  Administered 2018-08-13 (×2): 50 ug via INTRAVENOUS
  Administered 2018-08-13: 25 ug via INTRAVENOUS

## 2018-08-13 MED ORDER — DIPHENHYDRAMINE HCL 50 MG/ML IJ SOLN
INTRAMUSCULAR | Status: DC | PRN
  Administered 2018-08-13: 12.5 mg via INTRAVENOUS

## 2018-08-13 MED ORDER — CEFAZOLIN SODIUM-DEXTROSE 2-4 GM/100ML-% IV SOLN
INTRAVENOUS | Status: AC
  Filled 2018-08-13: qty 100

## 2018-08-13 MED ORDER — FENTANYL CITRATE (PF) 250 MCG/5ML IJ SOLN
INTRAMUSCULAR | Status: AC
  Filled 2018-08-13: qty 5

## 2018-08-13 MED ORDER — CELECOXIB 200 MG PO CAPS
ORAL_CAPSULE | ORAL | Status: AC
  Administered 2018-08-13: 200 mg via ORAL
  Filled 2018-08-13: qty 1

## 2018-08-13 MED ORDER — LACTATED RINGERS IV SOLN
INTRAVENOUS | Status: DC
  Administered 2018-08-13: 11:00:00 via INTRAVENOUS

## 2018-08-13 MED ORDER — FENTANYL CITRATE (PF) 100 MCG/2ML IJ SOLN
25.0000 ug | INTRAMUSCULAR | Status: AC | PRN
  Administered 2018-08-13 (×6): 25 ug via INTRAVENOUS

## 2018-08-13 MED ORDER — PROPOFOL 10 MG/ML IV BOLUS
INTRAVENOUS | Status: DC | PRN
  Administered 2018-08-13: 200 mg via INTRAVENOUS

## 2018-08-13 MED ORDER — FENTANYL CITRATE (PF) 100 MCG/2ML IJ SOLN
INTRAMUSCULAR | Status: AC
  Administered 2018-08-13: 25 ug via INTRAVENOUS
  Filled 2018-08-13: qty 2

## 2018-08-13 MED ORDER — KETOROLAC TROMETHAMINE 30 MG/ML IJ SOLN
INTRAMUSCULAR | Status: AC
  Filled 2018-08-13: qty 1

## 2018-08-13 MED ORDER — LIDOCAINE HCL (PF) 2 % IJ SOLN
INTRAMUSCULAR | Status: AC
  Filled 2018-08-13: qty 10

## 2018-08-13 MED ORDER — SUGAMMADEX SODIUM 500 MG/5ML IV SOLN
INTRAVENOUS | Status: DC | PRN
  Administered 2018-08-13: 250 mg via INTRAVENOUS

## 2018-08-13 MED ORDER — OXYCODONE-ACETAMINOPHEN 5-325 MG PO TABS
1.0000 | ORAL_TABLET | Freq: Once | ORAL | Status: AC
  Administered 2018-08-13: 1 via ORAL

## 2018-08-13 MED ORDER — ACETAMINOPHEN 500 MG PO TABS
ORAL_TABLET | ORAL | Status: AC
  Administered 2018-08-13: 1000 mg via ORAL
  Filled 2018-08-13: qty 2

## 2018-08-13 MED ORDER — SODIUM CHLORIDE FLUSH 0.9 % IV SOLN
INTRAVENOUS | Status: AC
  Filled 2018-08-13: qty 10

## 2018-08-13 MED ORDER — SUCCINYLCHOLINE CHLORIDE 20 MG/ML IJ SOLN
INTRAMUSCULAR | Status: DC | PRN
  Administered 2018-08-13: 160 mg via INTRAVENOUS

## 2018-08-13 MED ORDER — CHLORHEXIDINE GLUCONATE CLOTH 2 % EX PADS: 6.0000 | MEDICATED_PAD | Freq: Once | CUTANEOUS | Status: DC

## 2018-08-13 MED ORDER — ROCURONIUM BROMIDE 50 MG/5ML IV SOLN
INTRAVENOUS | Status: AC
  Filled 2018-08-13: qty 1

## 2018-08-13 MED ORDER — ONDANSETRON HCL 4 MG/2ML IJ SOLN: 4.0000 mg | Freq: Once | INTRAMUSCULAR | Status: DC | PRN

## 2018-08-13 MED ORDER — SUGAMMADEX SODIUM 500 MG/5ML IV SOLN
INTRAVENOUS | Status: AC
  Filled 2018-08-13: qty 5

## 2018-08-13 MED ORDER — DIPHENHYDRAMINE HCL 50 MG/ML IJ SOLN
INTRAMUSCULAR | Status: AC
  Filled 2018-08-13: qty 1

## 2018-08-13 MED ORDER — OXYCODONE-ACETAMINOPHEN 5-325 MG PO TABS
ORAL_TABLET | ORAL | Status: AC
  Filled 2018-08-13: qty 1

## 2018-08-13 MED ORDER — MIDAZOLAM HCL 2 MG/2ML IJ SOLN
INTRAMUSCULAR | Status: DC | PRN
  Administered 2018-08-13: 2 mg via INTRAVENOUS

## 2018-08-13 MED ORDER — HYDROCODONE-ACETAMINOPHEN 5-325 MG PO TABS: 1.0000 | ORAL_TABLET | ORAL | 0 refills | Status: DC | PRN

## 2018-08-13 MED ORDER — MIDAZOLAM HCL 2 MG/2ML IJ SOLN
INTRAMUSCULAR | Status: AC
  Filled 2018-08-13: qty 2

## 2018-08-13 MED ORDER — GABAPENTIN 300 MG PO CAPS
300.0000 mg | ORAL_CAPSULE | ORAL | Status: AC
  Administered 2018-08-13: 300 mg via ORAL

## 2018-08-13 MED ORDER — CEFAZOLIN SODIUM-DEXTROSE 2-4 GM/100ML-% IV SOLN
2.0000 g | INTRAVENOUS | Status: AC
  Administered 2018-08-13: 2 g via INTRAVENOUS

## 2018-08-13 MED ORDER — CELECOXIB 200 MG PO CAPS
200.0000 mg | ORAL_CAPSULE | ORAL | Status: AC
  Administered 2018-08-13: 200 mg via ORAL

## 2018-08-13 MED ORDER — DEXAMETHASONE SODIUM PHOSPHATE 10 MG/ML IJ SOLN
INTRAMUSCULAR | Status: DC | PRN
  Administered 2018-08-13: 8 mg via INTRAVENOUS

## 2018-08-13 MED ORDER — FAMOTIDINE 20 MG PO TABS
ORAL_TABLET | ORAL | Status: AC
  Administered 2018-08-13: 20 mg via ORAL
  Filled 2018-08-13: qty 1

## 2018-08-13 MED ORDER — ACETAMINOPHEN 500 MG PO TABS
1000.0000 mg | ORAL_TABLET | ORAL | Status: AC
  Administered 2018-08-13: 1000 mg via ORAL

## 2018-08-13 MED ORDER — PROPOFOL 10 MG/ML IV BOLUS
INTRAVENOUS | Status: AC
  Filled 2018-08-13: qty 20

## 2018-08-13 MED ORDER — PHENYLEPHRINE HCL 10 MG/ML IJ SOLN
INTRAMUSCULAR | Status: DC | PRN
  Administered 2018-08-13: 100 ug via INTRAVENOUS
  Administered 2018-08-13: 50 ug via INTRAVENOUS
  Administered 2018-08-13: 150 ug via INTRAVENOUS
  Administered 2018-08-13: 100 ug via INTRAVENOUS

## 2018-08-13 MED ORDER — DEXMEDETOMIDINE HCL 200 MCG/2ML IV SOLN
INTRAVENOUS | Status: DC | PRN
  Administered 2018-08-13: 16 ug via INTRAVENOUS

## 2018-08-13 MED ORDER — FAMOTIDINE 20 MG PO TABS
20.0000 mg | ORAL_TABLET | Freq: Once | ORAL | Status: AC
  Administered 2018-08-13: 20 mg via ORAL

## 2018-08-13 MED ORDER — BUPIVACAINE-EPINEPHRINE (PF) 0.25% -1:200000 IJ SOLN
INTRAMUSCULAR | Status: AC
  Filled 2018-08-13: qty 30

## 2018-08-13 MED ORDER — DEXAMETHASONE SODIUM PHOSPHATE 10 MG/ML IJ SOLN
INTRAMUSCULAR | Status: AC
  Filled 2018-08-13: qty 1

## 2018-08-13 MED ORDER — ROCURONIUM BROMIDE 100 MG/10ML IV SOLN
INTRAVENOUS | Status: DC | PRN
  Administered 2018-08-13: 10 mg via INTRAVENOUS
  Administered 2018-08-13: 35 mg via INTRAVENOUS
  Administered 2018-08-13: 5 mg via INTRAVENOUS
  Administered 2018-08-13: 20 mg via INTRAVENOUS

## 2018-08-13 MED ORDER — ONDANSETRON HCL 4 MG/2ML IJ SOLN
INTRAMUSCULAR | Status: DC | PRN
  Administered 2018-08-13: 4 mg via INTRAVENOUS

## 2018-08-13 MED ORDER — ONDANSETRON HCL 4 MG/2ML IJ SOLN
INTRAMUSCULAR | Status: AC
  Filled 2018-08-13: qty 2

## 2018-08-13 MED ORDER — SUCCINYLCHOLINE CHLORIDE 20 MG/ML IJ SOLN
INTRAMUSCULAR | Status: AC
  Filled 2018-08-13: qty 1

## 2018-08-13 MED ORDER — ACETAMINOPHEN 10 MG/ML IV SOLN
INTRAVENOUS | Status: AC
  Filled 2018-08-13: qty 100

## 2018-08-13 MED ORDER — GLYCOPYRROLATE 0.2 MG/ML IJ SOLN
INTRAMUSCULAR | Status: DC | PRN
  Administered 2018-08-13: 0.1 mg via INTRAVENOUS

## 2018-08-13 MED ORDER — BUPIVACAINE-EPINEPHRINE 0.25% -1:200000 IJ SOLN
INTRAMUSCULAR | Status: DC | PRN
  Administered 2018-08-13: 30 mL

## 2018-08-13 MED ORDER — LIDOCAINE HCL (CARDIAC) PF 100 MG/5ML IV SOSY
PREFILLED_SYRINGE | INTRAVENOUS | Status: DC | PRN
  Administered 2018-08-13: 80 mg via INTRAVENOUS

## 2018-08-13 MED ORDER — GABAPENTIN 300 MG PO CAPS
ORAL_CAPSULE | ORAL | Status: AC
  Administered 2018-08-13: 300 mg via ORAL
  Filled 2018-08-13: qty 1

## 2018-08-13 SURGICAL SUPPLY — 43 items
CANISTER SUCT 1200ML W/VALVE (MISCELLANEOUS) ×3 IMPLANT
CANNULA SEALS 8.5MM (CANNULA) ×2
CHLORAPREP W/TINT 26ML (MISCELLANEOUS) ×3 IMPLANT
CORD BIP STRL DISP 12FT (MISCELLANEOUS) ×3 IMPLANT
DEFOGGER SCOPE WARMER CLEARIFY (MISCELLANEOUS) ×3 IMPLANT
DERMABOND ADVANCED (GAUZE/BANDAGES/DRESSINGS) ×2
DERMABOND ADVANCED .7 DNX12 (GAUZE/BANDAGES/DRESSINGS) ×1 IMPLANT
DRAPE 3 ARM ACCESS DA VINCI (DRAPES) ×2
DRAPE 3 ARM ACCESS DVNC (DRAPES) ×1 IMPLANT
DRAPE SHEET LG 3/4 BI-LAMINATE (DRAPES) ×6 IMPLANT
ELECT REM PT RETURN 9FT ADLT (ELECTROSURGICAL) ×3
ELECTRODE REM PT RTRN 9FT ADLT (ELECTROSURGICAL) ×1 IMPLANT
GLOVE BIO SURGEON STRL SZ7 (GLOVE) ×12 IMPLANT
GOWN STRL REUS W/ TWL LRG LVL3 (GOWN DISPOSABLE) ×3 IMPLANT
GOWN STRL REUS W/TWL LRG LVL3 (GOWN DISPOSABLE) ×6
GRASPER SUT TROCAR 14GX15 (MISCELLANEOUS) ×3 IMPLANT
IV NS 1000ML (IV SOLUTION) ×2
IV NS 1000ML BAXH (IV SOLUTION) ×1 IMPLANT
KIT PINK PAD W/HEAD ARE REST (MISCELLANEOUS) ×3
KIT PINK PAD W/HEAD ARM REST (MISCELLANEOUS) ×1 IMPLANT
LABEL OR SOLS (LABEL) ×3 IMPLANT
MESH VENT LT ST 11.4CM CRL (Mesh General) ×3 IMPLANT
NEEDLE HYPO 22GX1.5 SAFETY (NEEDLE) ×3 IMPLANT
PACK LAP CHOLECYSTECTOMY (MISCELLANEOUS) ×3 IMPLANT
PROGRASP ENDOWRIST DA VINCI (INSTRUMENTS) ×2
PROGRASP ENDOWRIST DVNC (INSTRUMENTS) ×1 IMPLANT
SEAL CANN 8.5 DVNC (CANNULA) ×1 IMPLANT
SLEEVE ADV FIXATION 5X100MM (TROCAR) ×3 IMPLANT
SOLUTION ELECTROLUBE (MISCELLANEOUS) ×3 IMPLANT
SPONGE LAP 18X18 5 PK (GAUZE/BANDAGES/DRESSINGS) ×3 IMPLANT
STRAP SAFETY 5IN WIDE (MISCELLANEOUS) ×3 IMPLANT
SUT DVC VLOC 180 0 12IN GS21 (SUTURE) ×6
SUT MNCRL 4-0 (SUTURE) ×4
SUT MNCRL 4-0 27XMFL (SUTURE) ×2
SUT VIC AB 0 CT2 27 (SUTURE) ×3 IMPLANT
SUT VICRYL 0 AB UR-6 (SUTURE) ×6 IMPLANT
SUT VLOC 90 2/L VL 12 GS22 (SUTURE) ×6 IMPLANT
SUTURE DVC VLC 180 0 12IN GS21 (SUTURE) ×2 IMPLANT
SUTURE MNCRL 4-0 27XMF (SUTURE) ×2 IMPLANT
SYR 3ML LL SCALE MARK (SYRINGE) ×3 IMPLANT
TROCAR 130MM GELPORT  DAV (MISCELLANEOUS) ×3 IMPLANT
TROCAR XCEL NON-BLD 5MMX100MML (ENDOMECHANICALS) ×3 IMPLANT
TUBING INSUF HEATED (TUBING) ×3 IMPLANT

## 2018-08-13 NOTE — Anesthesia Postprocedure Evaluation (Signed)
Anesthesia Post Note  Patient: Jeffrey Francis  Procedure(s) Performed: ROBOTIC ASSISTED LAPAROSCOPIC VENTRAL/INCISIONAL HERNIA REPAIR (N/A )  Patient location during evaluation: PACU Anesthesia Type: General Level of consciousness: awake and alert Pain management: pain level controlled Vital Signs Assessment: post-procedure vital signs reviewed and stable Respiratory status: spontaneous breathing and respiratory function stable Cardiovascular status: stable Anesthetic complications: no     Last Vitals:  Vitals:   08/13/18 1351 08/13/18 1356  BP:  109/64  Pulse: 94 (!) 103  Resp: 12 16  Temp:    SpO2: 95% 94%    Last Pain:  Vitals:   08/13/18 1356  TempSrc:   PainSc: 7                  Orval Dortch K

## 2018-08-13 NOTE — Transfer of Care (Signed)
Immediate Anesthesia Transfer of Care Note  Patient: Jeffrey Francis  Procedure(s) Performed: ROBOTIC ASSISTED LAPAROSCOPIC VENTRAL/INCISIONAL HERNIA REPAIR (N/A )  Patient Location: PACU  Anesthesia Type:General  Level of Consciousness: sedated  Airway & Oxygen Therapy: Patient Spontanous Breathing and Patient connected to face mask oxygen  Post-op Assessment: Report given to RN and Post -op Vital signs reviewed and stable  Post vital signs: Reviewed  Last Vitals:  Vitals Value Taken Time  BP 96/64 08/13/2018  1:08 PM  Temp    Pulse 99 08/13/2018  1:08 PM  Resp 23 08/13/2018  1:08 PM  SpO2 96 % 08/13/2018  1:08 PM  Vitals shown include unvalidated device data.  Last Pain:  Vitals:   08/13/18 0934  TempSrc: Oral  PainSc: 0-No pain         Complications: No apparent anesthesia complications

## 2018-08-13 NOTE — Anesthesia Post-op Follow-up Note (Signed)
Anesthesia QCDR form completed.        

## 2018-08-13 NOTE — Progress Notes (Signed)
Ice to abdominal area

## 2018-08-13 NOTE — Anesthesia Preprocedure Evaluation (Signed)
Anesthesia Evaluation  Patient identified by MRN, date of birth, ID band Patient awake    Reviewed: Allergy & Precautions, NPO status , Patient's Chart, lab work & pertinent test results  History of Anesthesia Complications Negative for: history of anesthetic complications  Airway Mallampati: III       Dental  (+) Missing, Poor Dentition, Chipped   Pulmonary neg sleep apnea, neg COPD,           Cardiovascular (-) hypertension(-) Past MI and (-) CHF (-) dysrhythmias (-) Valvular Problems/Murmurs     Neuro/Psych neg Seizures Anxiety    GI/Hepatic Neg liver ROS, neg GERD  ,  Endo/Other  neg diabetes  Renal/GU negative Renal ROS     Musculoskeletal   Abdominal   Peds  Hematology   Anesthesia Other Findings   Reproductive/Obstetrics                             Anesthesia Physical Anesthesia Plan  ASA: II  Anesthesia Plan: General   Post-op Pain Management:    Induction: Intravenous  PONV Risk Score and Plan: 2 and Dexamethasone and Ondansetron  Airway Management Planned: Oral ETT  Additional Equipment:   Intra-op Plan:   Post-operative Plan:   Informed Consent: I have reviewed the patients History and Physical, chart, labs and discussed the procedure including the risks, benefits and alternatives for the proposed anesthesia with the patient or authorized representative who has indicated his/her understanding and acceptance.     Plan Discussed with:   Anesthesia Plan Comments:         Anesthesia Quick Evaluation

## 2018-08-13 NOTE — Discharge Instructions (Signed)
 Laparoscopic Ventral Hernia Repair, Care After This sheet gives you information about how to care for yourself after your procedure. Your health care provider may also give you more specific instructions. If you have problems or questions, contact your health care provider. What can I expect after the procedure? After the procedure, it is common to have:  Pain, discomfort, or soreness.  Follow these instructions at home: Incision care  Follow instructions from your health care provider about how to take care of your incision. Make sure you: ? Wash your hands with soap and water before you change your bandage (dressing) or before you touch your abdomen. If soap and water are not available, use hand sanitizer. ? Change your dressing as told by your health care provider. ? Leave stitches (sutures), skin glue, or adhesive strips in place. These skin closures may need to stay in place for 2 weeks or longer. If adhesive strip edges start to loosen and curl up, you may trim the loose edges. Do not remove adhesive strips completely unless your health care provider tells you to do that.  Check your incision area every day for signs of infection. Check for: ? Redness, swelling, or pain. ? Fluid or blood. ? Warmth. ? Pus or a bad smell. Bathing  Do not take baths, swim, or use a hot tub until your health care provider approves. Ask your health care provider if you can take showers. You may only be allowed to take sponge baths for bathing.  Keep your bandage (dressing) dry until your health care provider says it can be removed. Activity  Do not lift anything that is heavier than 10 lb (4.5 kg) until your health care provider approves.  Do not drive or use heavy machinery while taking prescription pain medicine. Ask your health care provider when it is safe for you to drive or use heavy machinery.  Do not drive for 24 hours if you were given a medicine to help you relax (sedative) during your  procedure.  Rest as told by your health care provider. You may return to your normal activities when your health care provider approves. General instructions  Take over-the-counter and prescription medicines only as told by your health care provider.  To prevent or treat constipation while you are taking prescription pain medicine, your health care provider may recommend that you: ? Take over-the-counter or prescription medicines. ? Eat foods that are high in fiber, such as fresh fruits and vegetables, whole grains, and beans. ? Limit foods that are high in fat and processed sugars, such as fried and sweet foods.  Drink enough fluid to keep your urine clear or pale yellow.  Hold a pillow over your abdomen when you cough or sneeze. This helps with pain.  Keep all follow-up visits as told by your health care provider. This is important. Contact a health care provider if:  You have: ? A fever or chills. ? Redness, swelling, or pain around your incision. ? Fluid or blood coming from your incision. ? Pus or a bad smell coming from your incision. ? Pain that gets worse or does not get better with medicine. ? Nausea or vomiting. ? A cough. ? Shortness of breath.  Your incision feels warm to the touch.  You have not had a bowel movement in three days.  You are not able to urinate. Get help right away if:  You have severe pain in your abdomen.  You have persistent nausea and vomiting.  You have   redness, warmth, or pain in your leg.  You have chest pain.  You have trouble breathing. Summary  After this procedure, it is common to have pain, discomfort, or soreness.  Follow instructions from your health care provider about how to take care of your incision.  Check your incision area every day for signs of infection. Report any signs of infection to your health care provider.  Keep all follow-up visits as told by your health care provider. This is important. This information  is not intended to replace advice given to you by your health care provider. Make sure you discuss any questions you have with your health care provider. Document Released: 10/16/2012 Document Revised: 06/21/2016 Document Reviewed: 06/21/2016 Elsevier Interactive Patient Education  2018 [employer]  

## 2018-08-13 NOTE — Anesthesia Procedure Notes (Signed)
Procedure Name: Intubation Performed by: Lia Foyer, RN Pre-anesthesia Checklist: Patient identified, Patient being monitored, Timeout performed, Emergency Drugs available and Suction available Patient Re-evaluated:Patient Re-evaluated prior to induction Oxygen Delivery Method: Circle system utilized Preoxygenation: Pre-oxygenation with 100% oxygen Induction Type: IV induction Ventilation: Mask ventilation without difficulty and Oral airway inserted - appropriate to patient size Laryngoscope Size: Mac and 4 Grade View: Grade II Tube type: Oral Tube size: 7.5 mm Number of attempts: 1 Airway Equipment and Method: Stylet Placement Confirmation: ETT inserted through vocal cords under direct vision,  positive ETCO2 and breath sounds checked- equal and bilateral Secured at: 23 cm Tube secured with: Tape Dental Injury: Teeth and Oropharynx as per pre-operative assessment

## 2018-08-13 NOTE — Op Note (Signed)
Robotic Ventral Hernia Repair using 11.4 cm Round ventralight ST Mesh ( BARD)   Pre-operative Diagnosis: Recurrent ventral hernia  Post-operative Diagnosis: same  Surgeon: Sterling Big, MD FACS  Anesthesia: Gen. with endotracheal tube  Findings: 4 cm ventral hernia with recurrence lateral to the edge of previous mesh  Estimated Blood Loss: 10cc         Specimens: none       Complications: none              Procedure Details  The patient was seen again in the Holding Room. The benefits, complications, treatment options, and expected outcomes were discussed with the patient. The risks of bleeding, infection, recurrence of symptoms, failure to resolve symptoms, bowel injury, mesh placement, mesh infection, any of which could require further surgery were reviewed with the patient. The likelihood of improving the patient's symptoms with return to their baseline status is good.  The patient and/or family concurred with the proposed plan, giving informed consent.  The patient was taken to Operating Room, identified as Jeffrey Francis and the procedure verified.  A Time Out was held and the above information confirmed.  Prior to the induction of general anesthesia, antibiotic prophylaxis was administered. VTE prophylaxis was in place. General endotracheal anesthesia was then administered and tolerated well. After the induction, the abdomen was prepped with Chloraprep and draped in the sterile fashion. The patient was positioned in the supine position.  A 12 mm incision was created in the left subcostal area and electrocautery was used to dissect through subtenons tissue.  The fascia was elevated between to kocher clamps and incised.  Vicryl sutures were placed.  A Hassan trocar was inserted and pneumoperitoneum was obtained. Laparoscopy revealed evidence of a 4 cm defect.  The recurrence was at the lateral edge and there was evidence of a previous mesh. 8 mm ports were placed on the left lateral  side under direct visualization.  I chose a mention went ahead and inserted.  The robot was brought to the surgical field and was docked in the standard fashion.  We ensured that there was no collision between arms and that the instruments were Under direct visualization at all times. I went to the consult and started dissection.  There was evidence of omentum that was adhered to the hernia sac and this was taken down with electrocautery in the standard fashion.  Once we reduce the hernia sac we sutured the defect using a running 0V lock suture in the standard fashion. Using a PMI we were able to guide the mesh and used the echo locator device.  The mesh was secured to the abdominal wall with 2 continuous 2 OV lock sutures in the standard fashion.  The mesh was placed in an underlay fashion,The mesh lay really nicely again significant abdominal wall. A second look laparoscopy revealed no evidence of intra-abdominal injury. The robot was undocked and  The laparoscopic ports were removed under direct visualization and the pneumoperitoneum was deflated.  Incisions were closed in a 2 layer fashion with 0 Vicryl for the fascia and 4-0 Monocryl. Dermabond was used to coat the skin. Marcaine quarter percent with epinephrine and lidocaine 1% was used to inject all the incision sites. Patient tolerated procedure well and there were no immediate complications. Needle and laparotomy counts were correct   Sterling Big, MD, FACS

## 2018-08-13 NOTE — Progress Notes (Signed)
Spoke with Dr. Everlene Farrier who states he would like patient shaved before surgery. Order placed.

## 2018-08-13 NOTE — Interval H&P Note (Signed)
History and Physical Interval Note:  08/13/2018 10:13 AM  Jeffrey Francis  has presented today for surgery, with the diagnosis of RECURRENT VENTRAL HERNIA  The various methods of treatment have been discussed with the patient and family. After consideration of risks, benefits and other options for treatment, the patient has consented to  Procedure(s): ROBOTIC ASSISTED LAPAROSCOPIC VENTRAL/INCISIONAL HERNIA REPAIR (N/A) as a surgical intervention .  The patient's history has been reviewed, patient examined, no change in status, stable for surgery.  I have reviewed the patient's chart and labs.  Questions were answered to the patient's satisfaction.     Kenroy Timberman F Catheline Hixon

## 2018-08-14 ENCOUNTER — Encounter: Payer: Self-pay | Admitting: Surgery

## 2018-08-26 ENCOUNTER — Ambulatory Visit (INDEPENDENT_AMBULATORY_CARE_PROVIDER_SITE_OTHER): Payer: BLUE CROSS/BLUE SHIELD | Admitting: Surgery

## 2018-08-26 ENCOUNTER — Encounter: Payer: Self-pay | Admitting: Surgery

## 2018-08-26 ENCOUNTER — Encounter: Payer: BLUE CROSS/BLUE SHIELD | Admitting: Surgery

## 2018-08-26 VITALS — BP 149/90 | HR 103 | Temp 98.1°F | Ht 68.0 in | Wt 243.4 lb

## 2018-08-26 DIAGNOSIS — Z09 Encounter for follow-up examination after completed treatment for conditions other than malignant neoplasm: Secondary | ICD-10-CM

## 2018-08-26 NOTE — Patient Instructions (Signed)

## 2018-08-26 NOTE — Progress Notes (Signed)
S/p robotic ventral H Doing very well No pain  PE NAD Abd: incisions c/d/i. No recurrence or infection  A/P Doing well No complications RTC prn

## 2018-08-28 ENCOUNTER — Telehealth: Payer: Self-pay | Admitting: Surgery

## 2018-08-28 NOTE — Telephone Encounter (Signed)
Patient is calling about his paperwork with work said his work doesn't have anything light duty for him to do. So patient is asking for a letter to be sent to his work. [employer] fax number is [phone], or [phone]. If any questions patient can be reached at [phone]. Please call and advise.

## 2018-08-28 NOTE — Telephone Encounter (Signed)
Call to [employer] to have them refax paperwork to report new return to work date of 09/19/18 per the last notes.

## 2018-09-11 ENCOUNTER — Telehealth: Payer: Self-pay

## 2018-09-11 NOTE — Telephone Encounter (Signed)
Patient's return to work form was filled out and faxed to [employer]. Patient is to return to work on 09/16/2018 without restrictions.

## 2018-09-13 ENCOUNTER — Encounter: Payer: Self-pay | Admitting: *Deleted

## 2019-08-03 IMAGING — CT CT ABDOMEN W/ CM
2 of 4 series · 17 of 46 positions shown, 19 images · IV contrast (iopamidol)
Comparison: None.

CLINICAL DATA: History of hernia repair. Epigastric abdominal pain.
Palpable abnormality on left-sided abdomen for several months.

EXAM:
CT ABDOMEN WITH CONTRAST
TECHNIQUE: Multidetector CT imaging of the abdomen was performed using the
standard protocol following bolus administration of intravenous
contrast.
CONTRAST:  100mL ZZUXMD-H99 IOPAMIDOL (ZZUXMD-H99) INJECTION 61%

[Series 2: abd pelvis · axial · 0.81mm/px · z∈[-1439,-1119]mm · 14 of 72 slices shown, 16 images (1 of 2)]
[im 4/72  soft-tissue]
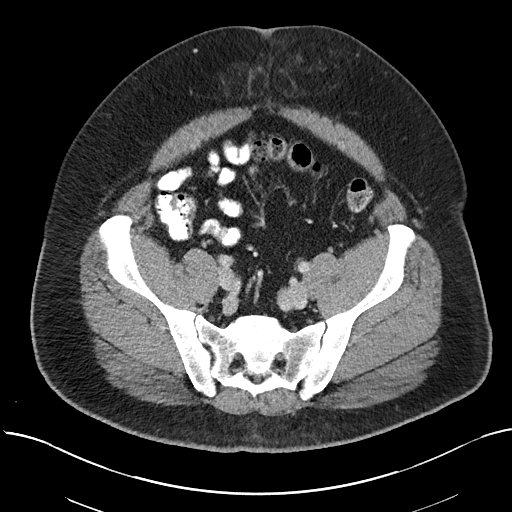
[im 4/72  bone]
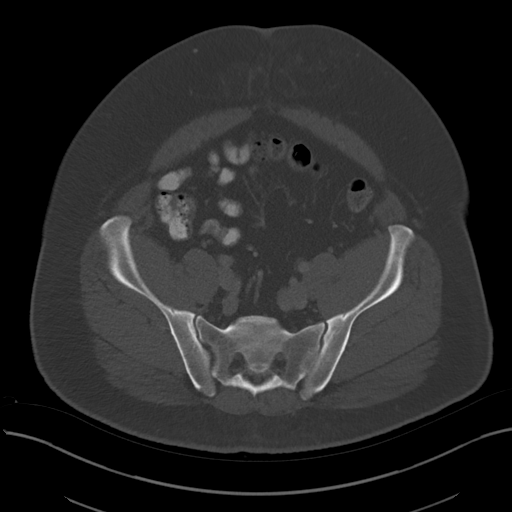
[im 11/72  soft-tissue]
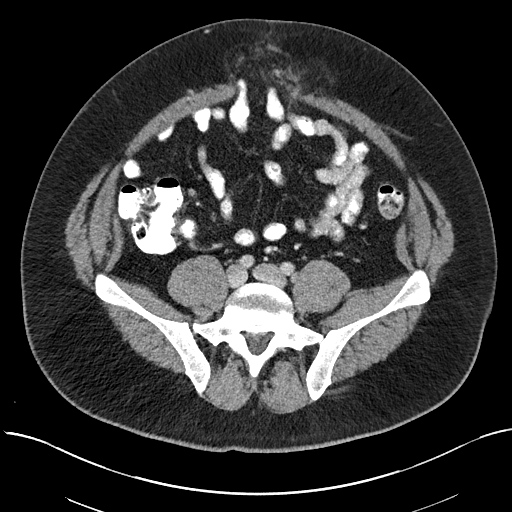
[im 15/72  soft-tissue]
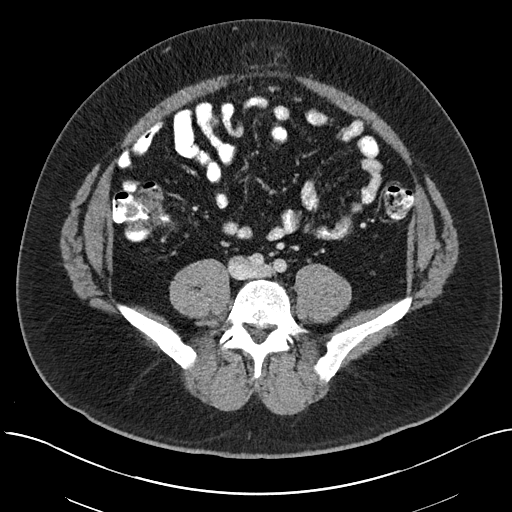
[im 18/72  soft-tissue]
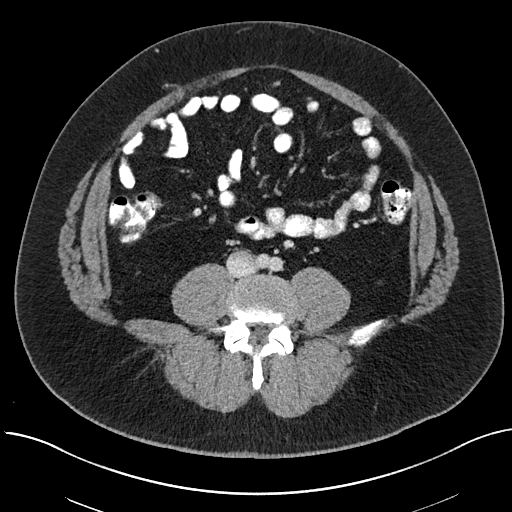
[im 25/72  soft-tissue]
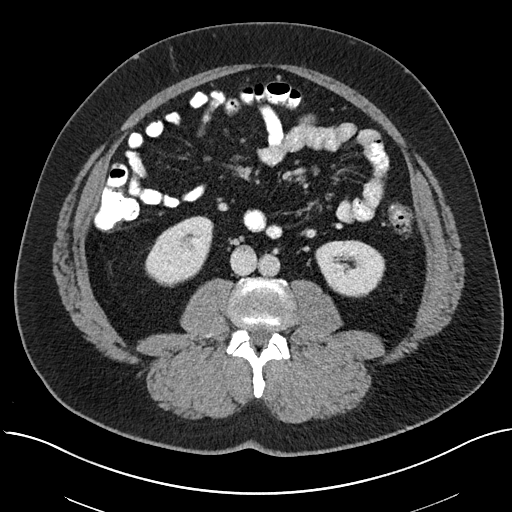
[im 29/72  soft-tissue]
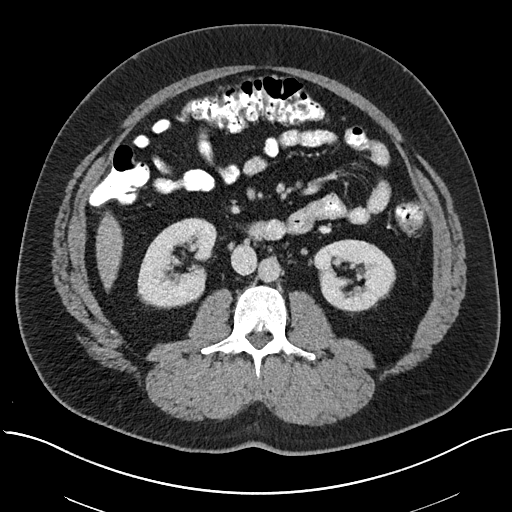
[im 32/72  soft-tissue]
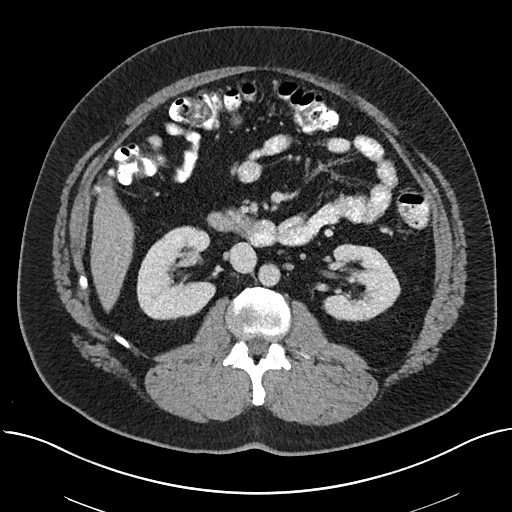
[im 40/72  soft-tissue]
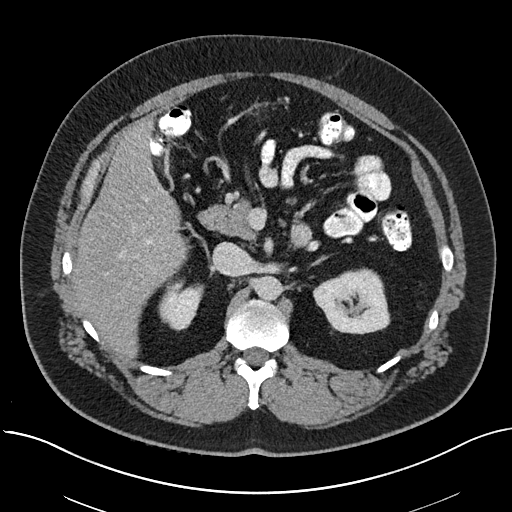
[im 43/72  soft-tissue]
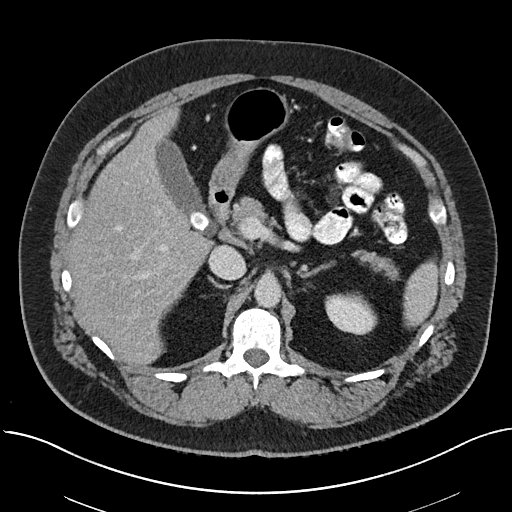
[im 43/72  bone]
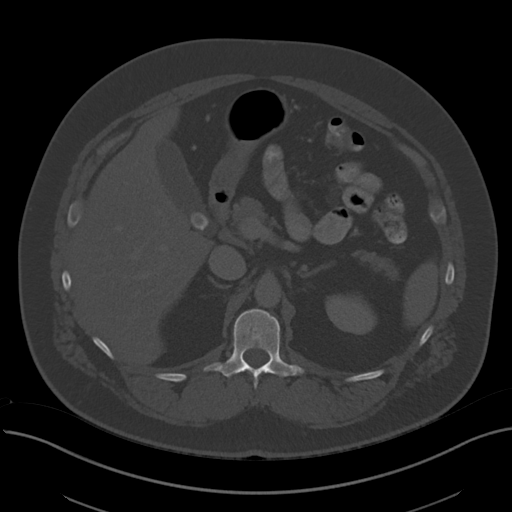
[im 47/72  soft-tissue]
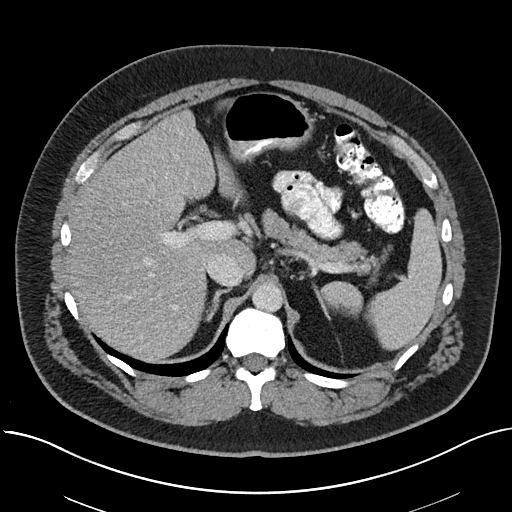
[im 54/72  soft-tissue]
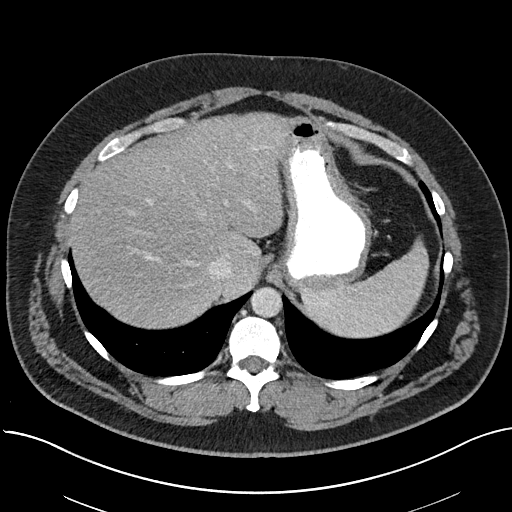
[im 57/72  soft-tissue]
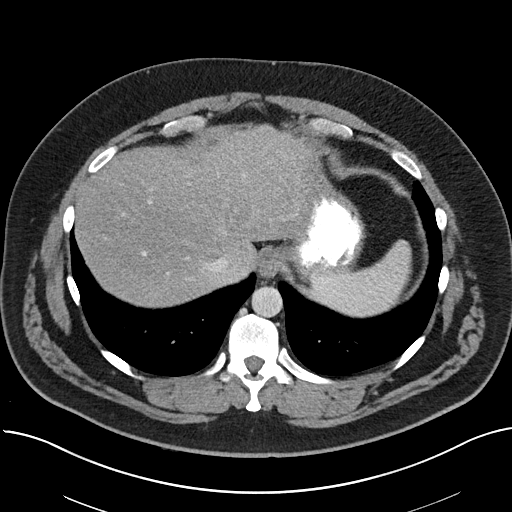
[im 61/72  soft-tissue]
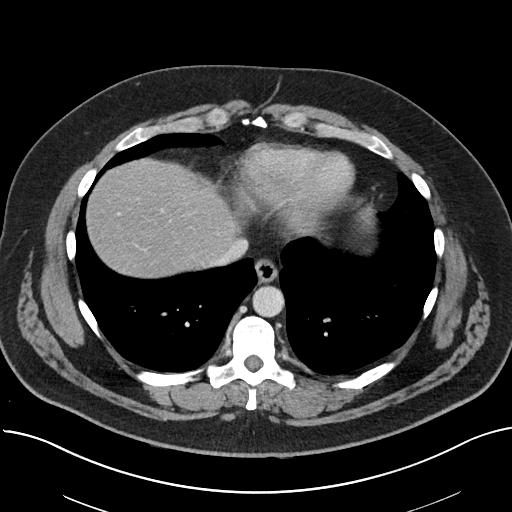
[im 68/72  soft-tissue]
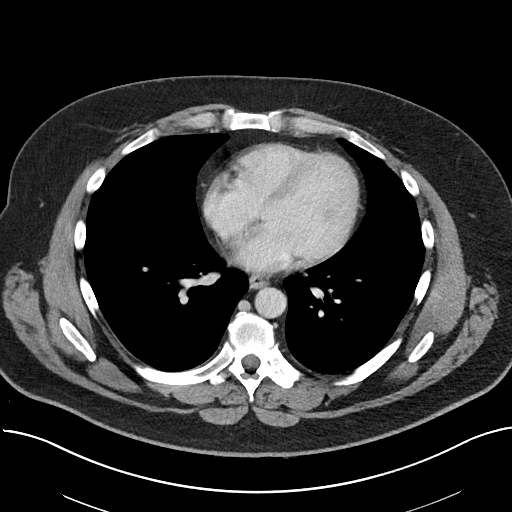

[Series 4: abd pelvis · coronal · 0.71mm/px · 3 of 178 slices shown (2 of 2)]
[im 60/178  soft-tissue]
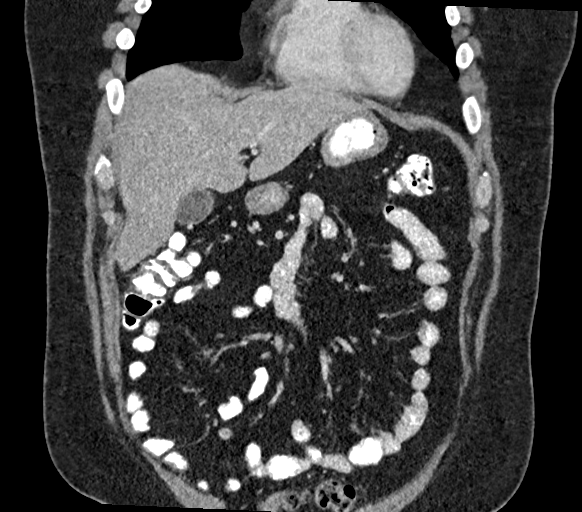
[im 79/178  soft-tissue]
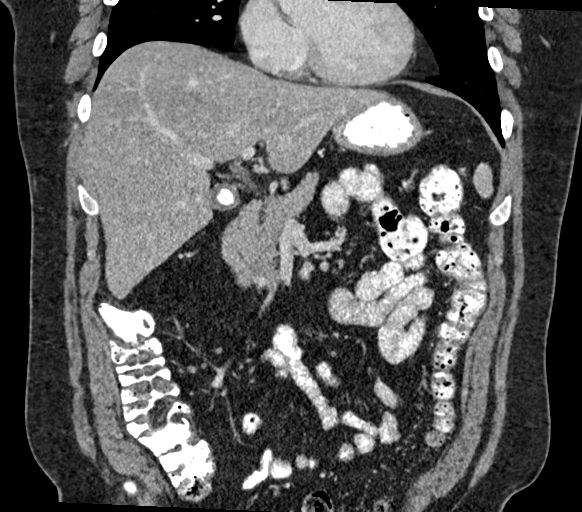
[im 99/178  soft-tissue]
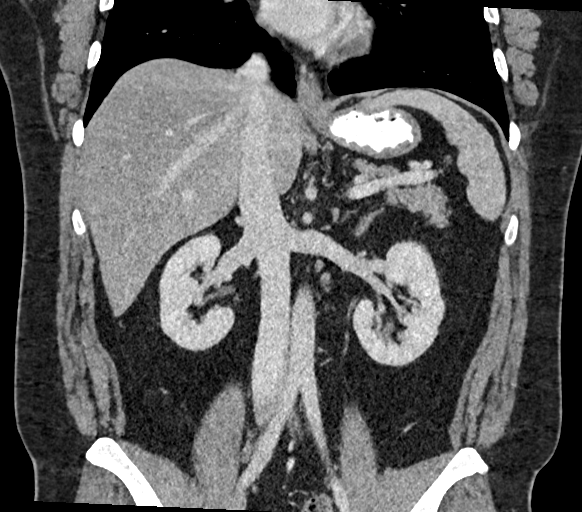

[17 of 46 positions shown; findings below may reference images not displayed]

FINDINGS: Lower chest: Clear lung bases. Normal heart size without pericardial
or pleural effusion.

Hepatobiliary: Normal liver. Stone in the gallbladder neck measures
1.5 cm. No evidence of acute cholecystitis or biliary duct
dilatation.

Pancreas: Normal, without mass or ductal dilatation.

Spleen: Normal in size, without focal abnormality.

Adrenals/Urinary Tract: Normal adrenal glands. Normal kidneys,
without hydronephrosis.

Stomach/Bowel: Normal stomach, without wall thickening. Normal colon
and terminal ileum. Small bowel is positioned within a lower
abdominal ventral hernia including on 65/2. No findings to suggest
obstruction or strangulation.

Vascular/Lymphatic: Normal caliber of the aorta and branch vessels.
No retroperitoneal or retrocrural adenopathy.

Other: No ascites.  No free intraperitoneal air.

Musculoskeletal: Transitional S1 vertebral body. Moderate disc bulge
at L3-4.
IMPRESSION: 1. Ventral lower abdominal wall hernia containing fat and
nonobstructive small bowel.
2. Cholelithiasis.

## 2019-11-20 ENCOUNTER — Other Ambulatory Visit: Payer: Self-pay

## 2019-11-20 ENCOUNTER — Ambulatory Visit (HOSPITAL_COMMUNITY)
Admission: EM | Admit: 2019-11-20 | Discharge: 2019-11-20 | Disposition: A | Discharge status: Home / Self Care | Payer: BC Managed Care – PPO | Attending: Family Medicine | Admitting: Family Medicine

## 2019-11-20 DIAGNOSIS — J069 Acute upper respiratory infection, unspecified: Secondary | ICD-10-CM | POA: Diagnosis not present

## 2019-11-20 DIAGNOSIS — R000 Tachycardia, unspecified: Secondary | ICD-10-CM

## 2019-11-20 DIAGNOSIS — R519 Headache, unspecified: Secondary | ICD-10-CM

## 2019-11-20 DIAGNOSIS — Z20822 Contact with and (suspected) exposure to covid-19: Secondary | ICD-10-CM | POA: Insufficient documentation

## 2019-11-20 DIAGNOSIS — Z1152 Encounter for screening for COVID-19: Secondary | ICD-10-CM

## 2019-11-20 LAB — POC SARS CORONAVIRUS 2 AG -  ED: SARS Coronavirus 2 Ag: NEGATIVE

## 2019-11-20 LAB — POC SARS CORONAVIRUS 2 AG: SARS Coronavirus 2 Ag: NEGATIVE

## 2019-11-20 NOTE — ED Provider Notes (Signed)
[facility]    CSN: [id number] Arrival date & time: 11/20/19  5176      History   Chief Complaint Chief Complaint  Patient presents with  . Nasal Congestion  . Sore Throat  . Headache  . Nausea    HPI Jeffrey Francis is a 44 y.o. male.   Patient reports to urgent care today for headache, body ache and sore throat that started yesterday. He also endorses a mild cough and nasal congestion. He reports taking ibuprofen for the headache. He denies productive cough or purulent nasal discharge. He has not had known sick contacts. He denies shortness of breath, chest pain, nausea, vomiting, diarrhea, loss of change of smell or taste.   Patient reports 2 children in the home under the age of 1 year. And history of headache noted.      Past Medical History:  Diagnosis Date  . Allergy    sinus problems  . Anxiety   . GERD (gastroesophageal reflux disease)    RARE-NO MEDS  . Headache    MIGRAINES  . Tachycardia     Patient Active Problem List   Diagnosis Date Noted  . Recurrent ventral hernia     Past Surgical History:  Procedure Laterality Date  . HERNIA REPAIR    . ROBOTIC ASSISTED LAPAROSCOPIC VENTRAL/INCISIONAL HERNIA REPAIR N/A 08/13/2018   Procedure: ROBOTIC ASSISTED LAPAROSCOPIC VENTRAL/INCISIONAL HERNIA REPAIR;  Surgeon: Leafy Ro, MD;  Location: [facility];  Service: General;  Laterality: N/A;  . UMBILICAL HERNIA REPAIR  2016       Home Medications    Prior to Admission medications   Medication Sig Start Date End Date Taking? Authorizing Provider  Ascorbic Acid (VITAMIN C) 1000 MG tablet Take 1,000 mg by mouth every evening.     [provider]  b complex vitamins tablet Take 1 tablet by mouth daily.    [provider]  ibuprofen (ADVIL,MOTRIN) 200 MG tablet Take 800 mg by mouth every 8 (eight) hours as needed (for pain.).    [provider]  loratadine (CLARITIN) 10 MG tablet Take 10 mg by mouth every morning.      [provider]  Multiple Vitamin (MULTIVITAMIN WITH MINERALS) TABS tablet Take 1 tablet by mouth daily.    [provider]  naproxen sodium (ALEVE) 220 MG tablet Take 660 mg by mouth 2 (two) times daily as needed (for pain.).    [provider]    Family History No family history on file.  Social History Social History   Tobacco Use  . Smoking status: Never Smoker  . Smokeless tobacco: Never Used  Substance Use Topics  . Alcohol use: Yes    Comment: SOCIAL  . Drug use: Never     Allergies   Patient has no known allergies.   Review of Systems Review of Systems  Constitutional: Positive for chills. Negative for fever.  HENT: Positive for congestion, postnasal drip, rhinorrhea and sore throat. Negative for ear pain, nosebleeds, sinus pressure and sinus pain.   Eyes: Negative for pain and visual disturbance.  Respiratory: Positive for cough. Negative for chest tightness and shortness of breath.   Cardiovascular: Negative for chest pain and palpitations.  Gastrointestinal: Negative for abdominal pain, diarrhea, nausea and vomiting.  Genitourinary: Negative for dysuria and hematuria.  Musculoskeletal: Positive for myalgias. Negative for arthralgias and back pain.  Skin: Negative for color change and rash.  Neurological: Positive for headaches. Negative for seizures and syncope.  All other  systems reviewed and are negative.    Physical Exam Triage Vital Signs ED Triage Vitals  Enc Vitals Group     BP 11/20/19 0956 (!) 137/92     Pulse Rate 11/20/19 0956 (!) 101     Resp 11/20/19 0956 17     Temp 11/20/19 0956 97.7 F (36.5 C)     Temp Source 11/20/19 0956 Oral     SpO2 11/20/19 0956 98 %     Weight --      Height --      Head Circumference --      Peak Flow --      Pain Score 11/20/19 0959 2     Pain Loc --      Pain Edu? --      Excl. in [place]? --    No data found.  Updated Vital Signs BP (!) 137/92 (BP Location: Left Arm)   Pulse  (!) 101   Temp 97.7 F (36.5 C) (Oral)   Resp 17   SpO2 98%   Visual Acuity Right Eye Distance:   Left Eye Distance:   Bilateral Distance:    Right Eye Near:   Left Eye Near:    Bilateral Near:     Physical Exam Vitals and nursing note reviewed.  Constitutional:      General: He is not in acute distress.    Appearance: He is well-developed and normal weight. He is not ill-appearing.  HENT:     Head: Normocephalic and atraumatic.     Right Ear: Tympanic membrane normal.     Left Ear: Tympanic membrane normal.     Nose: Congestion present. No rhinorrhea.     Comments: Turbinates with erythema    Mouth/Throat:     Mouth: Mucous membranes are moist. No oral lesions.     Pharynx: Oropharynx is clear. No pharyngeal swelling or posterior oropharyngeal erythema.     Tonsils: No tonsillar exudate or tonsillar abscesses.     Comments: Evidence of postnasal drip Eyes:     Conjunctiva/sclera: Conjunctivae normal.  Cardiovascular:     Rate and Rhythm: Regular rhythm. Tachycardia present.     Heart sounds: No murmur. No friction rub. No gallop.   Pulmonary:     Effort: Pulmonary effort is normal. No respiratory distress.     Breath sounds: Normal breath sounds. No wheezing, rhonchi or rales.  Abdominal:     Palpations: Abdomen is soft.     Tenderness: There is no abdominal tenderness.  Musculoskeletal:     Cervical back: Neck supple.  Lymphadenopathy:     Cervical: No cervical adenopathy.  Skin:    General: Skin is warm and dry.     Capillary Refill: Capillary refill takes less than 2 seconds.  Neurological:     General: No focal deficit present.     Mental Status: He is alert and oriented to person, place, and time.  Psychiatric:        Mood and Affect: Mood normal.        Behavior: Behavior normal.      UC Treatments / Results  Labs (all labs ordered are listed, but only abnormal results are displayed) Labs Reviewed  NOVEL CORONAVIRUS, NAA (HOSP ORDER, SEND-OUT TO  REF LAB; TAT 18-24 HRS)  POC SARS CORONAVIRUS 2 AG -  ED  POC SARS CORONAVIRUS 2 AG    EKG   Radiology No results found.  Procedures Procedures (including critical care time)  Medications Ordered in UC Medications -  No data to display  Initial Impression / Assessment and Plan / UC Course  I have reviewed the triage vital signs and the nursing notes.  Pertinent labs & imaging results that were available during my care of the patient were reviewed by me and considered in my medical decision making (see chart for details).     #Viral URI - Rapid Covid and Flu negative. COVID PCR sent. Symptomatic treatment with tylenol and ibuprofen for now. Encourage to distance from young children. ED precautions given.  Final Clinical Impressions(s) / UC Diagnoses   Final diagnoses:  Viral upper respiratory tract infection  Encounter for laboratory testing for COVID-19 virus     Discharge Instructions     Take tylenol 325mg  tablet x 2 up to every 6 hours for sore throat, fever and body aches. Do not exceed 8 tablets in 24 hours   Continue ibuprofen  Go directly to the Emergency Department or call 911 if you have severe chest pain, shortness of breath, severe diarrhea or feel as though you might pass out.    If your Covid-19 test is positive, you will receive a phone call from [facility] regarding your results. Negative test results are not called. Both positive and negative results area always visible on MyChart. If you do not have a MyChart account, sign up instructions are in your discharge papers.   Persons who are directed to care for themselves at home may discontinue isolation under the following conditions:  . At least 10 days have passed since symptom onset and . At least 24 hours have passed without running a fever (this means without the use of fever-reducing medications) and . Other symptoms have improved.  Persons infected with COVID-19 who never develop symptoms  may discontinue isolation and other precautions 10 days after the date of their first positive COVID-19 test.     ED Prescriptions    None     PDMP not reviewed this encounter.   [facility], PA-C 11/20/19 1128

## 2019-11-20 NOTE — ED Notes (Signed)
Patient with hx of tachycardia

## 2019-11-20 NOTE — ED Triage Notes (Signed)
Patient reports nasal congestion, sore throat, headache and nausea since yesterday. No fever. Mild cough. Patient works at [employer].

## 2019-11-20 NOTE — Discharge Instructions (Addendum)
Take tylenol 325mg  tablet x 2 up to every 6 hours for sore throat, fever and body aches. Do not exceed 8 tablets in 24 hours   Continue ibuprofen  Go directly to the Emergency Department or call 911 if you have severe chest pain, shortness of breath, severe diarrhea or feel as though you might pass out.    If your Covid-19 test is positive, you will receive a phone call from [facility] regarding your results. Negative test results are not called. Both positive and negative results area always visible on MyChart. If you do not have a MyChart account, sign up instructions are in your discharge papers.   Persons who are directed to care for themselves at home may discontinue isolation under the following conditions:   At least 10 days have passed since symptom onset and  At least 24 hours have passed without running a fever (this means without the use of fever-reducing medications) and  Other symptoms have improved.  Persons infected with COVID-19 who never develop symptoms may discontinue isolation and other precautions 10 days after the date of their first positive COVID-19 test.

## 2019-11-22 LAB — NOVEL CORONAVIRUS, NAA (HOSP ORDER, SEND-OUT TO REF LAB; TAT 18-24 HRS): SARS-CoV-2, NAA: NOT DETECTED

## 2019-12-05 LAB — POC INFLUENZA A AND B ANTIGEN (URGENT CARE ONLY)
Influenza A Ag: NEGATIVE
Influenza B Ag: NEGATIVE

## 2019-12-05 LAB — INFLUENZA A AND B ANTIGEN (CONVERTED LAB)
Influenza A Ag: NEGATIVE
Influenza B Ag: NEGATIVE

## 2020-05-20 ENCOUNTER — Ambulatory Visit
Admission: EM | Admit: 2020-05-20 | Discharge: 2020-05-20 | Disposition: A | Discharge status: Home / Self Care | Payer: BC Managed Care – PPO | Attending: Emergency Medicine | Admitting: Emergency Medicine

## 2020-05-20 DIAGNOSIS — J069 Acute upper respiratory infection, unspecified: Secondary | ICD-10-CM

## 2020-05-20 MED ORDER — BENZONATATE 100 MG PO CAPS: 100.0000 mg | ORAL_CAPSULE | Freq: Three times a day (TID) | ORAL | 0 refills | Status: DC | PRN

## 2020-05-20 NOTE — Discharge Instructions (Addendum)
Your rapid COVID test is negative; the send-out test is pending.  You should self quarantine until your test result is back and is negative.    Go to the emergency department if you develop high fever, shortness of breath, severe diarrhea, or other concerning symptoms.    

## 2020-05-20 NOTE — ED Provider Notes (Signed)
[name]    CSN: [id number] Arrival date & time: 05/20/20  1517      History   Chief Complaint Chief Complaint  Patient presents with  . Chest Congestion  . Cough  . Sore Throat    HPI Jeffrey Francis is a 44 y.o. male.   Patient presents with 5-day history of nasal congestion, cough productive of clear phlegm, ear fullness, and rhinorrhea.  He states he had a sore throat earlier in the week but this resolved.  He denies fever, chills, rash, shortness of breath, vomiting, diarrhea, or other symptoms.  Treatment attempted at home with Vicks sinus medication.  Patient has history of seasonal allergies.    The history is provided by the patient.    Past Medical History:  Diagnosis Date  . Allergy    sinus problems  . Anxiety   . GERD (gastroesophageal reflux disease)    RARE-NO MEDS  . Headache    MIGRAINES  . Tachycardia     Patient Active Problem List   Diagnosis Date Noted  . Recurrent ventral hernia     Past Surgical History:  Procedure Laterality Date  . HERNIA REPAIR    . ROBOTIC ASSISTED LAPAROSCOPIC VENTRAL/INCISIONAL HERNIA REPAIR N/A 08/13/2018   Procedure: ROBOTIC ASSISTED LAPAROSCOPIC VENTRAL/INCISIONAL HERNIA REPAIR;  Surgeon: Leafy Ro, MD;  Location: [facility]S;  Service: General;  Laterality: N/A;  . UMBILICAL HERNIA REPAIR  2016       Home Medications    Prior to Admission medications   Medication Sig Start Date End Date Taking? Authorizing Provider  Ascorbic Acid (VITAMIN C) 1000 MG tablet Take 1,000 mg by mouth every evening.     [provider]  b complex vitamins tablet Take 1 tablet by mouth daily.    [provider]  benzonatate (TESSALON) 100 MG capsule Take 1 capsule (100 mg total) by mouth 3 (three) times daily as needed for cough. 05/20/20   Mickie Bail, NP  ibuprofen (ADVIL,MOTRIN) 200 MG tablet Take 800 mg by mouth every 8 (eight) hours as needed (for pain.).    [provider]  loratadine  (CLARITIN) 10 MG tablet Take 10 mg by mouth every morning.     [provider]  Multiple Vitamin (MULTIVITAMIN WITH MINERALS) TABS tablet Take 1 tablet by mouth daily.    [provider]  naproxen sodium (ALEVE) 220 MG tablet Take 660 mg by mouth 2 (two) times daily as needed (for pain.).    [provider]    Family History History reviewed. No pertinent family history.  Social History Social History   Tobacco Use  . Smoking status: Never Smoker  . Smokeless tobacco: Never Used  Vaping Use  . Vaping Use: Never used  Substance Use Topics  . Alcohol use: Yes    Alcohol/week: 2.0 - 4.0 standard drinks    Types: 2 - 4 Standard drinks or equivalent per week  . Drug use: Never     Allergies   Patient has no known allergies.   Review of Systems Review of Systems  Constitutional: Negative for chills and fever.  HENT: Positive for congestion, ear pain, postnasal drip, rhinorrhea, sinus pressure and sore throat.   Eyes: Negative for pain and visual disturbance.  Respiratory: Negative for cough and shortness of breath.   Cardiovascular: Negative for chest pain and palpitations.  Gastrointestinal: Negative for abdominal pain, diarrhea, nausea and vomiting.  Genitourinary: Negative for dysuria and hematuria.  Musculoskeletal: Negative for  arthralgias and back pain.  Skin: Negative for color change and rash.  Neurological: Negative for seizures and syncope.  All other systems reviewed and are negative.    Physical Exam Triage Vital Signs ED Triage Vitals [05/20/20 0929]  Enc Vitals Group     BP      Pulse      Resp      Temp      Temp src      SpO2      Weight      Height      Head Circumference      Peak Flow      Pain Score 5     Pain Loc      Pain Edu?      Excl. in [place]?    No data found.  Updated Vital Signs BP (!) 144/94   Pulse (!) 105   Temp 98.2 F (36.8 C)   Resp 14   SpO2 96%   Visual Acuity Right Eye Distance:   Left  Eye Distance:   Bilateral Distance:    Right Eye Near:   Left Eye Near:    Bilateral Near:     Physical Exam Vitals and nursing note reviewed.  Constitutional:      General: He is not in acute distress.    Appearance: He is well-developed. He is not ill-appearing.  HENT:     Head: Normocephalic and atraumatic.     Right Ear: Tympanic membrane and ear canal normal.     Left Ear: Tympanic membrane and ear canal normal.     Nose: Congestion present.     Mouth/Throat:     Mouth: Mucous membranes are moist.     Pharynx: Oropharynx is clear.  Eyes:     Conjunctiva/sclera: Conjunctivae normal.  Cardiovascular:     Rate and Rhythm: Normal rate and regular rhythm.     Heart sounds: No murmur heard.   Pulmonary:     Effort: Pulmonary effort is normal. No respiratory distress.     Breath sounds: Normal breath sounds.  Abdominal:     Palpations: Abdomen is soft.     Tenderness: There is no abdominal tenderness. There is no guarding or rebound.  Musculoskeletal:     Cervical back: Neck supple.  Skin:    General: Skin is warm and dry.     Findings: No rash.  Neurological:     General: No focal deficit present.     Mental Status: He is alert and oriented to person, place, and time.     Gait: Gait normal.  Psychiatric:        Mood and Affect: Mood normal.        Behavior: Behavior normal.      UC Treatments / Results  Labs (all labs ordered are listed, but only abnormal results are displayed) Labs Reviewed  NOVEL CORONAVIRUS, NAA  POC SARS CORONAVIRUS 2 AG -  ED    EKG   Radiology No results found.  Procedures Procedures (including critical care time)  Medications Ordered in UC Medications - No data to display  Initial Impression / Assessment and Plan / UC Course  I have reviewed the triage vital signs and the nursing notes.  Pertinent labs & imaging results that were available during my care of the patient were reviewed by me and considered in my medical  decision making (see chart for details).   Viral URI with cough.  Treating with Tessalon Perles and OTC Mucinex.  POC COVID negative; PCR pending.  Instructed patient to self quarantine until the test result is back and to take Tylenol as needed for fever/discomfort.  Instructed patient to go to the emergency department if he develops high fever, shortness of breath, severe diarrhea, or other concerning symptoms.  Patient agrees with plan of care.    Final Clinical Impressions(s) / UC Diagnoses   Final diagnoses:  Viral URI with cough     Discharge Instructions     Your rapid COVID test is negative; the send-out test is pending.  You should self quarantine until your test result is back and is negative.    Go to the emergency department if you develop high fever, shortness of breath, severe diarrhea, or other concerning symptoms.       ED Prescriptions    Medication Sig Dispense Auth. Provider   benzonatate (TESSALON) 100 MG capsule Take 1 capsule (100 mg total) by mouth 3 (three) times daily as needed for cough. 21 capsule Mickie Bail, NP     PDMP not reviewed this encounter.   Mickie Bail, NP 05/20/20 1004

## 2020-05-20 NOTE — ED Triage Notes (Signed)
Patient reports he started feeling bad on Monday. Reports chest congestion, sore throat, and productive cough. Reports he has two grandchildren living with him at home. Reports one of his grandchildren recently started daycare and was diagnosed with RSV last week.   Denies: fever, shortness of breath, body aches, chills  OTC: VICKS sinex

## 2020-05-21 LAB — SARS-COV-2, NAA 2 DAY TAT

## 2020-05-21 LAB — NOVEL CORONAVIRUS, NAA: SARS-CoV-2, NAA: NOT DETECTED

## 2021-05-16 ENCOUNTER — Other Ambulatory Visit: Payer: Self-pay

## 2021-05-16 ENCOUNTER — Ambulatory Visit
Admission: EM | Admit: 2021-05-16 | Discharge: 2021-05-16 | Disposition: A | Discharge status: Home / Self Care | Payer: BC Managed Care – PPO | Attending: Emergency Medicine | Admitting: Emergency Medicine

## 2021-05-16 DIAGNOSIS — J069 Acute upper respiratory infection, unspecified: Secondary | ICD-10-CM | POA: Diagnosis not present

## 2021-05-16 DIAGNOSIS — Z1152 Encounter for screening for COVID-19: Secondary | ICD-10-CM

## 2021-05-16 NOTE — Discharge Instructions (Addendum)
Your COVID test is pending.  You should self quarantine until the test result is back.    Take Tylenol or ibuprofen as needed for fever or discomfort.  Rest and keep yourself hydrated.    Follow-up with your primary care provider if your symptoms are not improving.     

## 2021-05-16 NOTE — ED Provider Notes (Signed)
Jeffrey Francis    CSN: [id number] Arrival date & time: 05/16/21  0950      History   Chief Complaint Chief Complaint  Patient presents with   Nasal Congestion    HPI Jeffrey Francis is a 45 y.o. male.  Patient presents with fatigue, headache, congestion, cough, shortness of breath since yesterday evening.  He denies fever, vomiting, diarrhea, or other symptoms.  No treatments attempted at home.  His medical history includes seasonal allergies, migraine headaches, tachycardia, anxiety, GERD.  The history is provided by the patient and medical records.   Past Medical History:  Diagnosis Date   Allergy    sinus problems   Anxiety    GERD (gastroesophageal reflux disease)    RARE-NO MEDS   Headache    MIGRAINES   Tachycardia     Patient Active Problem List   Diagnosis Date Noted   Recurrent ventral hernia     Past Surgical History:  Procedure Laterality Date   HERNIA REPAIR     ROBOTIC ASSISTED LAPAROSCOPIC VENTRAL/INCISIONAL HERNIA REPAIR N/A 08/13/2018   Procedure: ROBOTIC ASSISTED LAPAROSCOPIC VENTRAL/INCISIONAL HERNIA REPAIR;  Surgeon: Leafy Ro, MD;  Location: [facility] ORS;  Service: General;  Laterality: N/A;   UMBILICAL HERNIA REPAIR  2016       Home Medications    Prior to Admission medications   Medication Sig Start Date End Date Taking? Authorizing Provider  Ascorbic Acid (VITAMIN C) 1000 MG tablet Take 1,000 mg by mouth every evening.     [provider]  b complex vitamins tablet Take 1 tablet by mouth daily.    [provider]  benzonatate (TESSALON) 100 MG capsule Take 1 capsule (100 mg total) by mouth 3 (three) times daily as needed for cough. 05/20/20   Mickie Bail, NP  ibuprofen (ADVIL,MOTRIN) 200 MG tablet Take 800 mg by mouth every 8 (eight) hours as needed (for pain.).    [provider]  loratadine (CLARITIN) 10 MG tablet Take 10 mg by mouth every morning.     [provider]  Multiple Vitamin  (MULTIVITAMIN WITH MINERALS) TABS tablet Take 1 tablet by mouth daily.    [provider]  naproxen sodium (ALEVE) 220 MG tablet Take 660 mg by mouth 2 (two) times daily as needed (for pain.).    [provider]    Family History No family history on file.  Social History Social History   Tobacco Use   Smoking status: Never   Smokeless tobacco: Never  Vaping Use   Vaping Use: Never used  Substance Use Topics   Alcohol use: Yes    Alcohol/week: 2.0 - 4.0 standard drinks    Types: 2 - 4 Standard drinks or equivalent per week   Drug use: Never     Allergies   Patient has no known allergies.   Review of Systems Review of Systems  Constitutional:  Positive for fatigue. Negative for chills and fever.  HENT:  Positive for congestion. Negative for ear pain and sore throat.   Respiratory:  Positive for cough and shortness of breath.   Cardiovascular:  Negative for chest pain and palpitations.  Gastrointestinal:  Negative for abdominal pain, diarrhea and vomiting.  Skin:  Negative for color change and rash.  Neurological:  Positive for headaches. Negative for weakness and numbness.  All other systems reviewed and are negative.   Physical Exam Triage Vital Signs ED Triage Vitals  Enc Vitals Group     BP  Pulse      Resp      Temp      Temp src      SpO2      Weight      Height      Head Circumference      Peak Flow      Pain Score      Pain Loc      Pain Edu?      Excl. in GC?    No data found.  Updated Vital Signs BP (!) 145/102 (BP Location: Left Arm)   Pulse (!) 112   Temp 98.8 F (37.1 C) (Oral)   Resp 18   Ht 5\' 9"  (1.753 m)   Wt 245 lb (111.1 kg)   SpO2 95%   BMI 36.18 kg/m   Visual Acuity Right Eye Distance:   Left Eye Distance:   Bilateral Distance:    Right Eye Near:   Left Eye Near:    Bilateral Near:     Physical Exam Vitals and nursing note reviewed.  Constitutional:      General: He is not in acute distress.     Appearance: He is well-developed. He is not ill-appearing.  HENT:     Head: Normocephalic and atraumatic.     Right Ear: Tympanic membrane normal.     Left Ear: Tympanic membrane normal.     Nose: Congestion present.     Mouth/Throat:     Mouth: Mucous membranes are moist.     Pharynx: Oropharynx is clear.  Eyes:     Conjunctiva/sclera: Conjunctivae normal.  Cardiovascular:     Rate and Rhythm: Normal rate and regular rhythm.     Heart sounds: Normal heart sounds.  Pulmonary:     Effort: Pulmonary effort is normal. No respiratory distress.     Breath sounds: Normal breath sounds.  Abdominal:     Palpations: Abdomen is soft.     Tenderness: There is no abdominal tenderness.  Musculoskeletal:     Cervical back: Neck supple.  Skin:    General: Skin is warm and dry.  Neurological:     General: No focal deficit present.     Mental Status: He is alert and oriented to person, place, and time.     Gait: Gait normal.  Psychiatric:        Mood and Affect: Mood normal.        Behavior: Behavior normal.     UC Treatments / Results  Labs (all labs ordered are listed, but only abnormal results are displayed) Labs Reviewed  NOVEL CORONAVIRUS, NAA    EKG   Radiology No results found.  Procedures Procedures (including critical care time)  Medications Ordered in UC Medications - No data to display  Initial Impression / Assessment and Plan / UC Course  I have reviewed the triage vital signs and the nursing notes.  Pertinent labs & imaging results that were available during my care of the patient were reviewed by me and considered in my medical decision making (see chart for details).     Viral URI, COVID test.  COVID pending.  Instructed patient to self quarantine per CDC guidelines.  Discussed symptomatic treatment including Tylenol or ibuprofen, rest, hydration.  Instructed patient to follow up with PCP if symptoms are not improving.  Patient agrees to plan of  care.  Final Clinical Impressions(s) / UC Diagnoses   Final diagnoses:  Encounter for screening for COVID-19  Viral URI  Discharge Instructions      Your COVID test is pending.  You should self quarantine until the test result is back.    Take Tylenol or ibuprofen as needed for fever or discomfort.  Rest and keep yourself hydrated.    Follow-up with your primary care provider if your symptoms are not improving.         ED Prescriptions   None    PDMP not reviewed this encounter.   Mickie Bail, NP 05/16/21 1025

## 2021-05-16 NOTE — ED Triage Notes (Signed)
Pt reports having congestion, fatigue, headache and feeling SOB. Symptoms began last night.

## 2021-05-18 LAB — NOVEL CORONAVIRUS, NAA: SARS-CoV-2, NAA: DETECTED — AB

## 2021-05-18 LAB — SARS-COV-2, NAA 2 DAY TAT

## 2022-06-21 ENCOUNTER — Ambulatory Visit
Admission: EM | Admit: 2022-06-21 | Discharge: 2022-06-21 | Disposition: A | Discharge status: Home / Self Care | Payer: BC Managed Care – PPO | Attending: Family Medicine | Admitting: Family Medicine

## 2022-06-21 ENCOUNTER — Encounter: Payer: Self-pay | Admitting: *Deleted

## 2022-06-21 ENCOUNTER — Other Ambulatory Visit: Payer: Self-pay

## 2022-06-21 DIAGNOSIS — J0140 Acute pansinusitis, unspecified: Secondary | ICD-10-CM | POA: Diagnosis not present

## 2022-06-21 MED ORDER — AZITHROMYCIN 250 MG PO TABS: ORAL_TABLET | ORAL | 0 refills | Status: DC

## 2022-06-21 MED ORDER — PREDNISONE 20 MG PO TABS: 20.0000 mg | ORAL_TABLET | Freq: Every day | ORAL | 0 refills | Status: AC

## 2022-06-21 NOTE — ED Triage Notes (Signed)
Pt reports since Sunday he has had sinus congestion and cough.

## 2022-06-21 NOTE — Discharge Instructions (Signed)
Complete entire course of treatment. If symptoms worsen at anytime or do not improve within 7 days return for evaluation.

## 2022-06-21 NOTE — ED Provider Notes (Signed)
Jeffrey Francis    CSN: [id number] Arrival date & time: 06/21/22  5956      History   Chief Complaint Chief Complaint  Patient presents with   Facial Pain   Nasal Congestion   Cough    HPI Jeffrey Francis is a 46 y.o. male.   HPI Patient with a history of asthma, presents with URI symptoms including cough, nasal congestion, runny nose, facial pressure, and headache. No known sick contacts. Cough is mild and occasional. He has taken multiple OTC medication without improvement.  No afebrile. Denies worrisome symptoms of shortness of breath, weakness, N&V, or chest pain.  Past Medical History:  Diagnosis Date   Allergy    sinus problems   Anxiety    GERD (gastroesophageal reflux disease)    RARE-NO MEDS   Headache    MIGRAINES   Tachycardia     Patient Active Problem List   Diagnosis Date Noted   Recurrent ventral hernia     Past Surgical History:  Procedure Laterality Date   HERNIA REPAIR     ROBOTIC ASSISTED LAPAROSCOPIC VENTRAL/INCISIONAL HERNIA REPAIR N/A 08/13/2018   Procedure: ROBOTIC ASSISTED LAPAROSCOPIC VENTRAL/INCISIONAL HERNIA REPAIR;  Surgeon: Leafy Ro, MD;  Location: [facility] ORS;  Service: General;  Laterality: N/A;   UMBILICAL HERNIA REPAIR  2016       Home Medications    Prior to Admission medications   Medication Sig Start Date End Date Taking? Authorizing Provider  azithromycin (ZITHROMAX) 250 MG tablet Take 2 tabs PO x 1 dose, then 1 tab PO QD x 4 days 06/21/22  Yes Bing Neighbors, FNP  predniSONE (DELTASONE) 20 MG tablet Take 1 tablet (20 mg total) by mouth daily with breakfast for 5 days. 06/21/22 06/26/22 Yes Bing Neighbors, FNP  Ascorbic Acid (VITAMIN C) 1000 MG tablet Take 1,000 mg by mouth every evening.     [provider]  b complex vitamins tablet Take 1 tablet by mouth daily.    [provider]  benzonatate (TESSALON) 100 MG capsule Take 1 capsule (100 mg total) by mouth 3 (three) times daily as needed for  cough. 05/20/20   Mickie Bail, NP  ibuprofen (ADVIL,MOTRIN) 200 MG tablet Take 800 mg by mouth every 8 (eight) hours as needed (for pain.).    [provider]  loratadine (CLARITIN) 10 MG tablet Take 10 mg by mouth every morning.     [provider]  Multiple Vitamin (MULTIVITAMIN WITH MINERALS) TABS tablet Take 1 tablet by mouth daily.    [provider]  naproxen sodium (ALEVE) 220 MG tablet Take 660 mg by mouth 2 (two) times daily as needed (for pain.).    [provider]    Family History History reviewed. No pertinent family history.  Social History Social History   Tobacco Use   Smoking status: Never   Smokeless tobacco: Never  Vaping Use   Vaping Use: Never used  Substance Use Topics   Alcohol use: Yes    Alcohol/week: 2.0 - 4.0 standard drinks of alcohol    Types: 2 - 4 Standard drinks or equivalent per week   Drug use: Never     Allergies   Patient has no known allergies.   Review of Systems Review of Systems Pertinent negatives listed in HPI   Physical Exam Triage Vital Signs ED Triage Vitals [06/21/22 0956]  Enc Vitals Group     BP      Pulse  Resp      Temp      Temp src      SpO2      Weight      Height      Head Circumference      Peak Flow      Pain Score 0     Pain Loc      Pain Edu?      Excl. in GC?    No data found.  Updated Vital Signs BP (!) 141/100   Pulse 89   Temp 98 F (36.7 C)   Resp 20   SpO2 96%   Visual Acuity Right Eye Distance:   Left Eye Distance:   Bilateral Distance:    Right Eye Near:   Left Eye Near:    Bilateral Near:     Physical Exam  General Appearance:    Alert, cooperative, no distress  HENT:   Normocephalic, ears normal, nares mucosal edema with congestion, rhinorrhea, oropharynx  w/o erythema or exudate   Eyes:    PERRL, conjunctiva/corneas clear, EOM's intact       Lungs:     Clear to auscultation bilaterally, respirations unlabored  Heart:    Regular rate  and rhythm  Neurologic:   Awake, alert, oriented x 3. No apparent focal neurological           defect.      UC Treatments / Results  Labs (all labs ordered are listed, but only abnormal results are displayed) Labs Reviewed - No data to display  EKG   Radiology No results found.  Procedures Procedures (including critical care time)  Medications Ordered in UC Medications - No data to display  Initial Impression / Assessment and Plan / UC Course  I have reviewed the triage vital signs and the nursing notes.  Pertinent labs & imaging results that were available during my care of the patient were reviewed by me and considered in my medical decision making (see chart for details).    Treatment plan and recommendations discussed. Patient verbalized understanding and agreement with plan. Return precautions given. Final Clinical Impressions(s) / UC Diagnoses   Final diagnoses:  Acute non-recurrent pansinusitis     Discharge Instructions      Complete entire course of treatment. If symptoms worsen at anytime or do not improve within 7 days return for evaluation.     ED Prescriptions     Medication Sig Dispense Auth. Provider   azithromycin (ZITHROMAX) 250 MG tablet Take 2 tabs PO x 1 dose, then 1 tab PO QD x 4 days 6 tablet Bing Neighbors, FNP   predniSONE (DELTASONE) 20 MG tablet Take 1 tablet (20 mg total) by mouth daily with breakfast for 5 days. 5 tablet Bing Neighbors, FNP      PDMP not reviewed this encounter.   Bing Neighbors, FNP 06/22/22 [id number]

## 2022-06-30 ENCOUNTER — Ambulatory Visit
Admission: EM | Admit: 2022-06-30 | Discharge: 2022-06-30 | Disposition: A | Discharge status: Home / Self Care | Payer: BC Managed Care – PPO | Attending: Family Medicine | Admitting: Family Medicine

## 2022-06-30 ENCOUNTER — Encounter: Payer: Self-pay | Admitting: Emergency Medicine

## 2022-06-30 DIAGNOSIS — J329 Chronic sinusitis, unspecified: Secondary | ICD-10-CM

## 2022-06-30 DIAGNOSIS — J310 Chronic rhinitis: Secondary | ICD-10-CM

## 2022-06-30 MED ORDER — PREDNISONE 20 MG PO TABS: 20.0000 mg | ORAL_TABLET | Freq: Every day | ORAL | 0 refills | Status: AC

## 2022-06-30 MED ORDER — IPRATROPIUM BROMIDE 0.03 % NA SOLN
2.0000 | Freq: Three times a day (TID) | NASAL | 0 refills | Status: DC | PRN
Start: 2022-06-30 — End: 2022-12-23

## 2022-06-30 NOTE — ED Provider Notes (Signed)
[facility]    CSN: [id number] Arrival date & time: 06/30/22  1420      History   Chief Complaint Chief Complaint  Patient presents with  . URI    HPI Jeffrey Francis is a 46 y.o. male.   HPI Patient with history chronic allergies and sinus problem,  seen here in clinic 06/21/22, and treated with a round azithromycin and prednisone and reports symptoms improved while on medication. He reports symptoms of nasal congestion, sinus pressure and headache with otalgia returned after medication was completed. Patient endorses that he takes Cetrizine daily,along with daily vitamin and vitamin C.   Past Medical History:  Diagnosis Date  . Allergy    sinus problems  . Anxiety   . GERD (gastroesophageal reflux disease)    RARE-NO MEDS  . Headache    MIGRAINES  . Tachycardia     Patient Active Problem List   Diagnosis Date Noted  . Recurrent ventral hernia     Past Surgical History:  Procedure Laterality Date  . HERNIA REPAIR    . ROBOTIC ASSISTED LAPAROSCOPIC VENTRAL/INCISIONAL HERNIA REPAIR N/A 08/13/2018   Procedure: ROBOTIC ASSISTED LAPAROSCOPIC VENTRAL/INCISIONAL HERNIA REPAIR;  Surgeon: Leafy Ro, MD;  Location: [facility] OR[facility];  Service: General;  Laterality: N/A;  . UMBILICAL HERNIA REPAIR  2016       Home Medications    Prior to Admission medications   Medication Sig Start Date End Date Taking? Authorizing Provider  ipratropium (ATROVENT) 0.03 % nasal spray Place 2 sprays into both nostrils 3 (three) times daily as needed for rhinitis. 06/30/22  Yes Bing Neighbors, FNP  predniSONE (DELTASONE) 20 MG tablet Take 1 tablet (20 mg total) by mouth daily for 5 days. 06/30/22 07/05/22 Yes Bing Neighbors, FNP  Ascorbic Acid (VITAMIN C) 1000 MG tablet Take 1,000 mg by mouth every evening.     [provider]  azithromycin (ZITHROMAX) 250 MG tablet Take 2 tabs PO x 1 dose, then 1 tab PO QD x 4 days 06/21/22   Bing Neighbors, FNP  b complex vitamins  tablet Take 1 tablet by mouth daily.    [provider]  benzonatate (TESSALON) 100 MG capsule Take 1 capsule (100 mg total) by mouth 3 (three) times daily as needed for cough. 05/20/20   Mickie Bail, NP  ibuprofen (ADVIL,MOTRIN) 200 MG tablet Take 800 mg by mouth every 8 (eight) hours as needed (for pain.).    [provider]  loratadine (CLARITIN) 10 MG tablet Take 10 mg by mouth every morning.     [provider]  Multiple Vitamin (MULTIVITAMIN WITH MINERALS) TABS tablet Take 1 tablet by mouth daily.    [provider]  naproxen sodium (ALEVE) 220 MG tablet Take 660 mg by mouth 2 (two) times daily as needed (for pain.).    [provider]    Family History History reviewed. No pertinent family history.  Social History Social History   Tobacco Use  . Smoking status: Never  . Smokeless tobacco: Never  Vaping Use  . Vaping Use: Never used  Substance Use Topics  . Alcohol use: Yes    Alcohol/week: 2.0 - 4.0 standard drinks of alcohol    Types: 2 - 4 Standard drinks or equivalent per week  . Drug use: Never     Allergies   Patient has no known allergies.   Review of Systems Review of Systems Pertinent negatives listed in HPI   Physical Exam Triage  Vital Signs ED Triage Vitals  Enc Vitals Group     BP 06/30/22 1428 (!) 163/100     Pulse Rate 06/30/22 1428 (!) 105     Resp 06/30/22 1428 16     Temp 06/30/22 1428 97.9 F (36.6 C)     Temp Source 06/30/22 1428 Oral     SpO2 06/30/22 1428 95 %     Weight --      Height --      Head Circumference --      Peak Flow --      Pain Score 06/30/22 1429 5     Pain Loc --      Pain Edu? --      Excl. in GC? --    No data found.  Updated Vital Signs BP (!) 163/100 (BP Location: Right Arm)   Pulse (!) 105   Temp 97.9 F (36.6 C) (Oral)   Resp 16   SpO2 95%   Visual Acuity Right Eye Distance:   Left Eye Distance:   Bilateral Distance:    Right Eye Near:   Left Eye Near:     Bilateral Near:     Physical Exam   UC Treatments / Results  Labs (all labs ordered are listed, but only abnormal results are displayed) Labs Reviewed - No data to display  EKG   Radiology No results found.  Procedures Procedures (including critical care time)  Medications Ordered in UC Medications - No data to display  Initial Impression / Assessment and Plan / UC Course  I have reviewed the triage vital signs and the nursing notes.  Pertinent labs & imaging results that were available during my care of the patient were reviewed by me and considered in my medical decision making (see chart for details).    Treatment per discharge medication orders.   Final Clinical Impressions(s) / UC Diagnoses   Final diagnoses:  Rhinosinusitis   Discharge Instructions   None    ED Prescriptions     Medication Sig Dispense Auth. Provider   ipratropium (ATROVENT) 0.03 % nasal spray Place 2 sprays into both nostrils 3 (three) times daily as needed for rhinitis. 30 mL Bing Neighbors, FNP   predniSONE (DELTASONE) 20 MG tablet Take 1 tablet (20 mg total) by mouth daily for 5 days. 5 tablet Bing Neighbors, FNP      PDMP not reviewed this encounter.

## 2022-06-30 NOTE — ED Triage Notes (Signed)
Seen on 06/21/2022 for URI, given a z-pack and prednisone per patient. Felt better while on medications, meds finished Sunday. Symptoms returned/worsened over the last week. Now complaining of continued nasal congestion, sinus pain/pressure, headache, ears popping.

## 2022-07-25 ENCOUNTER — Other Ambulatory Visit: Payer: Self-pay | Admitting: Family Medicine

## 2022-08-10 ENCOUNTER — Ambulatory Visit
Admission: EM | Admit: 2022-08-10 | Discharge: 2022-08-10 | Disposition: A | Discharge status: Home / Self Care | Payer: BC Managed Care – PPO | Attending: Orthopedic Surgery | Admitting: Orthopedic Surgery

## 2022-08-10 DIAGNOSIS — J339 Nasal polyp, unspecified: Secondary | ICD-10-CM

## 2022-08-10 DIAGNOSIS — J0110 Acute frontal sinusitis, unspecified: Secondary | ICD-10-CM

## 2022-08-10 MED ORDER — AMOXICILLIN-POT CLAVULANATE 875-125 MG PO TABS: 1.0000 | ORAL_TABLET | Freq: Two times a day (BID) | ORAL | 0 refills | Status: AC

## 2022-08-10 MED ORDER — PREDNISONE 10 MG PO TABS: 10.0000 mg | ORAL_TABLET | Freq: Every day | ORAL | 0 refills | Status: DC

## 2022-08-10 MED ORDER — FLUTICASONE PROPIONATE 50 MCG/ACT NA SUSP
2.0000 | Freq: Every day | NASAL | 2 refills | Status: DC
Start: 2022-08-10 — End: 2024-03-27

## 2022-08-10 NOTE — Discharge Instructions (Addendum)
Please use nasal steroid spray twice daily in each nostril.  Take oral steroids as prescribed.  Take antibiotics as prescribed.  Call ENT today to schedule follow-up appointment in 7 to 10 days for recheck

## 2022-08-10 NOTE — ED Triage Notes (Signed)
Patient to Urgent Care with complaints of nasal congestion x1 month. Patient was previously seen for the same and prescribed an antibiotic and a nasal spray, states initially this helped but once he completed the course of medication his symptoms returned.

## 2022-08-10 NOTE — ED Provider Notes (Signed)
Roderic Palau    CSN: [id number] Arrival date & time: 08/10/22  7341      History   Chief Complaint Chief Complaint  Patient presents with   Nasal Congestion   Otalgia    HPI Jeffrey Francis is a 46 y.o. male.  Presents to the urgent care for evaluation of nasal congestion.  He feels like he has an obstruction in his nose.  He then sick with a lot of nasal congestion for several weeks.  He has been on antibiotic as well as a steroid taper with some improvement of the symptoms but after completion of medication his symptoms returned.  He denies any fevers.  He does have some facial pressure and sinus symptoms.  He has been using some rinses with little relief.  HPI  Past Medical History:  Diagnosis Date   Allergy    sinus problems   Anxiety    GERD (gastroesophageal reflux disease)    RARE-NO MEDS   Headache    MIGRAINES   Tachycardia     Patient Active Problem List   Diagnosis Date Noted   Recurrent ventral hernia     Past Surgical History:  Procedure Laterality Date   HERNIA REPAIR     ROBOTIC ASSISTED LAPAROSCOPIC VENTRAL/INCISIONAL HERNIA REPAIR N/A 08/13/2018   Procedure: ROBOTIC ASSISTED LAPAROSCOPIC VENTRAL/INCISIONAL HERNIA REPAIR;  Surgeon: Jules Husbands, MD;  Location: [facility] ORS;  Service: General;  Laterality: N/A;   UMBILICAL HERNIA REPAIR  2016       Home Medications    Prior to Admission medications   Medication Sig Start Date End Date Taking? Authorizing Provider  amoxicillin-clavulanate (AUGMENTIN) 875-125 MG tablet Take 1 tablet by mouth every 12 (twelve) hours for 10 days. 08/10/22 08/20/22 Yes Duanne Guess, PA-C  fluticasone (FLONASE) 50 MCG/ACT nasal spray Place 2 sprays into both nostrils daily. 08/10/22  Yes Duanne Guess, PA-C  predniSONE (DELTASONE) 10 MG tablet Take 1 tablet (10 mg total) by mouth daily. 6,5,4,3,2,1 six day taper 08/10/22  Yes Duanne Guess, PA-C  Ascorbic Acid (VITAMIN C) 1000 MG tablet Take 1,000 mg by  mouth every evening.     [provider]  azithromycin (ZITHROMAX) 250 MG tablet Take 2 tabs PO x 1 dose, then 1 tab PO QD x 4 days 06/21/22   Scot Jun, FNP  b complex vitamins tablet Take 1 tablet by mouth daily.    [provider]  benzonatate (TESSALON) 100 MG capsule Take 1 capsule (100 mg total) by mouth 3 (three) times daily as needed for cough. 05/20/20   Sharion Balloon, NP  ibuprofen (ADVIL,MOTRIN) 200 MG tablet Take 800 mg by mouth every 8 (eight) hours as needed (for pain.).    [provider]  ipratropium (ATROVENT) 0.03 % nasal spray Place 2 sprays into both nostrils 3 (three) times daily as needed for rhinitis. 06/30/22   Scot Jun, FNP  loratadine (CLARITIN) 10 MG tablet Take 10 mg by mouth every morning.     [provider]  Multiple Vitamin (MULTIVITAMIN WITH MINERALS) TABS tablet Take 1 tablet by mouth daily.    [provider]  naproxen sodium (ALEVE) 220 MG tablet Take 660 mg by mouth 2 (two) times daily as needed (for pain.).    [provider]    Family History History reviewed. No pertinent family history.  Social History Social History   Tobacco Use   Smoking status: Never   Smokeless tobacco: Never  Vaping Use   Vaping Use: Never used  Substance Use Topics   Alcohol use: Yes    Alcohol/week: 2.0 - 4.0 standard drinks of alcohol    Types: 2 - 4 Standard drinks or equivalent per week   Drug use: Never     Allergies   Patient has no known allergies.   Review of Systems Review of Systems   Physical Exam Triage Vital Signs ED Triage Vitals  Enc Vitals Group     BP 08/10/22 0948 (!) 142/93     Pulse Rate 08/10/22 0948 99     Resp 08/10/22 0948 18     Temp 08/10/22 0948 97.9 F (36.6 C)     Temp src --      SpO2 08/10/22 0948 97 %     Weight 08/10/22 1001 250 lb (113.4 kg)     Height 08/10/22 1001 5\' 9"  (1.753 m)     Head Circumference --      Peak Flow --      Pain Score 08/10/22  1000 0     Pain Loc --      Pain Edu? --      Excl. in GC? --    No data found.  Updated Vital Signs BP (!) 142/93   Pulse 99   Temp 97.9 F (36.6 C)   Resp 18   Ht 5\' 9"  (1.753 m)   Wt 250 lb (113.4 kg)   SpO2 97%   BMI 36.92 kg/m   Visual Acuity Right Eye Distance:   Left Eye Distance:   Bilateral Distance:    Right Eye Near:   Left Eye Near:    Bilateral Near:     Physical Exam Constitutional:      Appearance: He is well-developed.  HENT:     Head: Normocephalic and atraumatic.     Comments: Positive sinusitis tenderness on exam along the frontal sinuses    Right Ear: External ear normal.     Left Ear: External ear normal.     Nose: Nose normal.     Comments: Large right greater than left nasal polyps.  No bleeding.  Nasal polyps are large, clear and fluctuant    Mouth/Throat:     Pharynx: No oropharyngeal exudate.  Eyes:     General:        Right eye: No discharge.        Left eye: No discharge.     Conjunctiva/sclera: Conjunctivae normal.  Cardiovascular:     Rate and Rhythm: Normal rate.  Pulmonary:     Effort: Pulmonary effort is normal. No respiratory distress.     Breath sounds: No stridor. No wheezing.  Musculoskeletal:        General: Normal range of motion.     Cervical back: Normal range of motion.  Lymphadenopathy:     Cervical: No cervical adenopathy.  Skin:    General: Skin is warm.     Findings: No rash.  Neurological:     Mental Status: He is alert and oriented to person, place, and time.  Psychiatric:        Behavior: Behavior normal.        Thought Content: Thought content normal.      UC Treatments / Results  Labs (all labs ordered are listed, but only abnormal results are displayed) Labs Reviewed - No data to display  EKG   Radiology No results found.  Procedures Procedures (including critical care time)  Medications Ordered in UC  Medications - No data to display  Initial Impression / Assessment and Plan / UC  Course  I have reviewed the triage vital signs and the nursing notes.  Pertinent labs & imaging results that were available during my care of the patient were reviewed by me and considered in my medical decision making (see chart for details).     46 year old male with several weeks of nasal congestion and sinus pain.  He is placed on Augmentin.  He will use a steroid nasal spray for his nasal polyps and also be placed on a oral steroid.  I recommend he call ear nose and throat specialist Dr. Levada Schilling today, he is on-call.  Recommend patient schedules an appointment for the next week or 2.  If symptoms do not improve would recommend he see Dr. Sheran Spine Final Clinical Impressions(s) / UC Diagnoses   Final diagnoses:  Nasal polyp  Acute non-recurrent frontal sinusitis     Discharge Instructions      Please use nasal steroid spray twice daily in each nostril.  Take oral steroids as prescribed.  Take antibiotics as prescribed.  Call ENT today to schedule follow-up appointment in 7 to 10 days for recheck   ED Prescriptions     Medication Sig Dispense Auth. Provider   fluticasone (FLONASE) 50 MCG/ACT nasal spray Place 2 sprays into both nostrils daily. 18.2 mL Evon Slack, PA-C   amoxicillin-clavulanate (AUGMENTIN) 875-125 MG tablet Take 1 tablet by mouth every 12 (twelve) hours for 10 days. 20 tablet Evon Slack, PA-C   predniSONE (DELTASONE) 10 MG tablet Take 1 tablet (10 mg total) by mouth daily. 6,5,4,3,2,1 six day taper 21 tablet Evon Slack, PA-C      PDMP not reviewed this encounter.   Evon Slack, PA-C 08/10/22 1017

## 2022-10-08 ENCOUNTER — Other Ambulatory Visit
Admission: RE | Admit: 2022-10-08 | Discharge: 2022-10-08 | Disposition: A | Discharge status: Home / Self Care | Payer: BC Managed Care – PPO | Source: Ambulatory Visit | Attending: Urgent Care | Admitting: Urgent Care

## 2022-10-08 ENCOUNTER — Ambulatory Visit
Admission: EM | Admit: 2022-10-08 | Discharge: 2022-10-08 | Disposition: A | Discharge status: Home / Self Care | Payer: BC Managed Care – PPO | Attending: Urgent Care | Admitting: Urgent Care

## 2022-10-08 DIAGNOSIS — R6889 Other general symptoms and signs: Secondary | ICD-10-CM

## 2022-10-08 DIAGNOSIS — Z1152 Encounter for screening for COVID-19: Secondary | ICD-10-CM | POA: Diagnosis not present

## 2022-10-08 LAB — RESP PANEL BY RT-PCR (RSV, FLU A&B, COVID)  RVPGX2
Influenza A by PCR: NEGATIVE
Influenza B by PCR: NEGATIVE
Resp Syncytial Virus by PCR: NEGATIVE
SARS Coronavirus 2 by RT PCR: NEGATIVE

## 2022-10-08 NOTE — ED Provider Notes (Signed)
Jeffrey Francis    CSN: [id number] Arrival date & time: 10/08/22  3007      History   Chief Complaint Chief Complaint  Patient presents with   Nasal Congestion   Sore Throat   Chills   Cough    HPI Jeffrey Francis is a 46 y.o. male.    Sore Throat  Cough   Presents to urgent care with complaint of sore throat, nasal congestion, cough, chills, headache x 2 days. Denies documented fever. Denies n/v/d.  Last use if antipyretic tomorrow.  Past Medical History:  Diagnosis Date   Allergy    sinus problems   Anxiety    GERD (gastroesophageal reflux disease)    RARE-NO MEDS   Headache    MIGRAINES   Tachycardia     Patient Active Problem List   Diagnosis Date Noted   Recurrent ventral hernia     Past Surgical History:  Procedure Laterality Date   HERNIA REPAIR     ROBOTIC ASSISTED LAPAROSCOPIC VENTRAL/INCISIONAL HERNIA REPAIR N/A 08/13/2018   Procedure: ROBOTIC ASSISTED LAPAROSCOPIC VENTRAL/INCISIONAL HERNIA REPAIR;  Surgeon: Leafy Ro, MD;  Location: [facility] ORS;  Service: General;  Laterality: N/A;   UMBILICAL HERNIA REPAIR  2016       Home Medications    Prior to Admission medications   Medication Sig Start Date End Date Taking? Authorizing Provider  Ascorbic Acid (VITAMIN C) 1000 MG tablet Take 1,000 mg by mouth every evening.     [provider]  azithromycin (ZITHROMAX) 250 MG tablet Take 2 tabs PO x 1 dose, then 1 tab PO QD x 4 days 06/21/22   Bing Neighbors, FNP  b complex vitamins tablet Take 1 tablet by mouth daily.    [provider]  benzonatate (TESSALON) 100 MG capsule Take 1 capsule (100 mg total) by mouth 3 (three) times daily as needed for cough. 05/20/20   Mickie Bail, NP  fluticasone (FLONASE) 50 MCG/ACT nasal spray Place 2 sprays into both nostrils daily. 08/10/22   Evon Slack, PA-C  ibuprofen (ADVIL,MOTRIN) 200 MG tablet Take 800 mg by mouth every 8 (eight) hours as needed (for pain.).    [provider]  ipratropium (ATROVENT) 0.03 % nasal spray Place 2 sprays into both nostrils 3 (three) times daily as needed for rhinitis. 06/30/22   Bing Neighbors, FNP  loratadine (CLARITIN) 10 MG tablet Take 10 mg by mouth every morning.     [provider]  Multiple Vitamin (MULTIVITAMIN WITH MINERALS) TABS tablet Take 1 tablet by mouth daily.    [provider]  naproxen sodium (ALEVE) 220 MG tablet Take 660 mg by mouth 2 (two) times daily as needed (for pain.).    [provider]  predniSONE (DELTASONE) 10 MG tablet Take 1 tablet (10 mg total) by mouth daily. 6,5,4,3,2,1 six day taper 08/10/22   Ronnette Juniper    Family History History reviewed. No pertinent family history.  Social History Social History   Tobacco Use   Smoking status: Never   Smokeless tobacco: Never  Vaping Use   Vaping Use: Never used  Substance Use Topics   Alcohol use: Yes    Alcohol/week: 2.0 - 4.0 standard drinks of alcohol    Types: 2 - 4 Standard drinks or equivalent per week   Drug use: Never     Allergies   Patient has no known allergies.   Review of Systems Review of Systems  Respiratory:  Positive for cough.      Physical Exam Triage Vital Signs ED Triage Vitals  Enc Vitals Group     BP 10/08/22 1017 (!) 162/103     Pulse Rate 10/08/22 1017 (!) 108     Resp 10/08/22 1017 19     Temp 10/08/22 1017 98.1 F (36.7 C)     Temp src --      SpO2 10/08/22 1017 95 %     Weight --      Height --      Head Circumference --      Peak Flow --      Pain Score 10/08/22 1018 5     Pain Loc --      Pain Edu? --      Excl. in GC? --    No data found.  Updated Vital Signs BP (!) 162/103 Comment: Provider is aware and notfied. Pt states high blood pressure is a common issue for him and is not intersted in couseling.  Pulse (!) 108   Temp 98.1 F (36.7 C)   Resp 19   SpO2 95%   Visual Acuity Right Eye Distance:   Left Eye Distance:   Bilateral  Distance:    Right Eye Near:   Left Eye Near:    Bilateral Near:     Physical Exam Vitals and nursing note reviewed.  Constitutional:      Appearance: He is ill-appearing.  HENT:     Mouth/Throat:     Pharynx: Posterior oropharyngeal erythema present. No oropharyngeal exudate.  Cardiovascular:     Rate and Rhythm: Normal rate and regular rhythm.     Heart sounds: Normal heart sounds.  Pulmonary:     Effort: Pulmonary effort is normal.     Breath sounds: Normal breath sounds.  Skin:    General: Skin is warm and dry.  Neurological:     General: No focal deficit present.     Mental Status: He is alert and oriented to person, place, and time.  Psychiatric:        Mood and Affect: Mood normal.        Behavior: Behavior normal.      UC Treatments / Results  Labs (all labs ordered are listed, but only abnormal results are displayed) Labs Reviewed - No data to display  EKG   Radiology No results found.  Procedures Procedures (including critical care time)  Medications Ordered in UC Medications - No data to display  Initial Impression / Assessment and Plan / UC Course  I have reviewed the triage vital signs and the nursing notes.  Pertinent labs & imaging results that were available during my care of the patient were reviewed by me and considered in my medical decision making (see chart for details).   Patient is afebrile here without recent antipyretics. Satting well on room air. Overall is well appearing, well hydrated, without respiratory distress. Pulmonary exam is unremarkable.  Lungs CTAB.   Suspect viral etiology including flu.  Respiratory swab results are pending.  Recommended use of OTC medications for symptom control, rest, push hydration.  Final Clinical Impressions(s) / UC Diagnoses   Final diagnoses:  None   Discharge Instructions   None    ED Prescriptions   None    PDMP not reviewed this encounter.   Charma Igo, FNP 10/08/22  1045

## 2022-10-08 NOTE — ED Triage Notes (Signed)
Pt. Presents to UC w/ c/o a sore throat, nasal congestion, a cough, chills and a headache for the past 2 days.

## 2022-10-08 NOTE — Discharge Instructions (Addendum)
Follow up here or with your primary care provider if your symptoms are worsening or not improving.     

## 2022-10-13 ENCOUNTER — Ambulatory Visit
Admission: EM | Admit: 2022-10-13 | Discharge: 2022-10-13 | Disposition: A | Discharge status: Home / Self Care | Payer: BC Managed Care – PPO | Attending: Emergency Medicine | Admitting: Emergency Medicine

## 2022-10-13 DIAGNOSIS — J0100 Acute maxillary sinusitis, unspecified: Secondary | ICD-10-CM

## 2022-10-13 MED ORDER — AMOXICILLIN 875 MG PO TABS: 875.0000 mg | ORAL_TABLET | Freq: Two times a day (BID) | ORAL | 0 refills | Status: AC

## 2022-10-13 NOTE — ED Provider Notes (Signed)
Jeffrey Francis    CSN: [id number] Arrival date & time: 10/13/22  1538      History   Chief Complaint Chief Complaint  Patient presents with   Generalized Body Aches    Stuffy nose, chest hurts, achy - Entered by patient    HPI Jeffrey Francis is a 46 y.o. male.  Patient presents with 1 week history of sinus congestion, sinus pressure, postnasal drip, runny nose, cough.  No fever, shortness of breath, vomiting, diarrhea, or other symptoms.  Treatment at home with OTC cold medication.  Patient was seen at this urgent care on 10/08/2022; diagnosed with flulike illness; treated symptomatically; negative for COVID, flu, RSV.  The history is provided by the patient and medical records.    Past Medical History:  Diagnosis Date   Allergy    sinus problems   Anxiety    GERD (gastroesophageal reflux disease)    RARE-NO MEDS   Headache    MIGRAINES   Tachycardia     Patient Active Problem List   Diagnosis Date Noted   Recurrent ventral hernia     Past Surgical History:  Procedure Laterality Date   HERNIA REPAIR     ROBOTIC ASSISTED LAPAROSCOPIC VENTRAL/INCISIONAL HERNIA REPAIR N/A 08/13/2018   Procedure: ROBOTIC ASSISTED LAPAROSCOPIC VENTRAL/INCISIONAL HERNIA REPAIR;  Surgeon: Jules Husbands, MD;  Location: [facility] OR[facility];  Service: General;  Laterality: N/A;   UMBILICAL HERNIA REPAIR  2016       Home Medications    Prior to Admission medications   Medication Sig Start Date End Date Taking? Authorizing Provider  amoxicillin (AMOXIL) 875 MG tablet Take 1 tablet (875 mg total) by mouth 2 (two) times daily for 10 days. 10/13/22 10/23/22 Yes Sharion Balloon, NP  Ascorbic Acid (VITAMIN C) 1000 MG tablet Take 1,000 mg by mouth every evening.     [provider]  b complex vitamins tablet Take 1 tablet by mouth daily.    [provider]  fluticasone (FLONASE) 50 MCG/ACT nasal spray Place 2 sprays into both nostrils daily. 08/10/22   Duanne Guess, PA-C   ibuprofen (ADVIL,MOTRIN) 200 MG tablet Take 800 mg by mouth every 8 (eight) hours as needed (for pain.).    [provider]  ipratropium (ATROVENT) 0.03 % nasal spray Place 2 sprays into both nostrils 3 (three) times daily as needed for rhinitis. 06/30/22   Scot Jun, FNP  loratadine (CLARITIN) 10 MG tablet Take 10 mg by mouth every morning.     [provider]  Multiple Vitamin (MULTIVITAMIN WITH MINERALS) TABS tablet Take 1 tablet by mouth daily.    [provider]  naproxen sodium (ALEVE) 220 MG tablet Take 660 mg by mouth 2 (two) times daily as needed (for pain.).    [provider]    Family History History reviewed. No pertinent family history.  Social History Social History   Tobacco Use   Smoking status: Never   Smokeless tobacco: Never  Vaping Use   Vaping Use: Never used  Substance Use Topics   Alcohol use: Yes    Alcohol/week: 2.0 - 4.0 standard drinks of alcohol    Types: 2 - 4 Standard drinks or equivalent per week   Drug use: Never     Allergies   Patient has no known allergies.   Review of Systems Review of Systems  Constitutional:  Negative for chills and fever.  HENT:  Positive for congestion, postnasal drip, rhinorrhea and sinus pressure. Negative for  ear pain and sore throat.   Respiratory:  Positive for cough. Negative for shortness of breath.   Cardiovascular:  Negative for chest pain and palpitations.  Gastrointestinal:  Negative for abdominal pain, diarrhea and vomiting.  Skin:  Negative for color change and rash.  All other systems reviewed and are negative.    Physical Exam Triage Vital Signs ED Triage Vitals  Enc Vitals Group     BP      Pulse      Resp      Temp      Temp src      SpO2      Weight      Height      Head Circumference      Peak Flow      Pain Score      Pain Loc      Pain Edu?      Excl. in [place]?    No data found.  Updated Vital Signs BP (!) 140/85   Pulse (!) 107    Temp 98 F (36.7 C)   Resp 18   Ht 5\' 9"  (1.753 m)   Wt 250 lb (113.4 kg)   SpO2 98%   BMI 36.92 kg/m   Visual Acuity Right Eye Distance:   Left Eye Distance:   Bilateral Distance:    Right Eye Near:   Left Eye Near:    Bilateral Near:     Physical Exam Vitals and nursing note reviewed.  Constitutional:      General: He is not in acute distress.    Appearance: Normal appearance. He is well-developed. He is not ill-appearing.  HENT:     Right Ear: Tympanic membrane normal.     Left Ear: Tympanic membrane normal.     Nose: Congestion and rhinorrhea present.     Mouth/Throat:     Mouth: Mucous membranes are moist.     Pharynx: Oropharynx is clear.  Cardiovascular:     Rate and Rhythm: Normal rate and regular rhythm.     Heart sounds: Normal heart sounds.  Pulmonary:     Effort: Pulmonary effort is normal. No respiratory distress.     Breath sounds: Normal breath sounds.  Musculoskeletal:     Cervical back: Neck supple.  Skin:    General: Skin is warm and dry.  Neurological:     Mental Status: He is alert.  Psychiatric:        Mood and Affect: Mood normal.        Behavior: Behavior normal.      UC Treatments / Results  Labs (all labs ordered are listed, but only abnormal results are displayed) Labs Reviewed - No data to display  EKG   Radiology No results found.  Procedures Procedures (including critical care time)  Medications Ordered in UC Medications - No data to display  Initial Impression / Assessment and Plan / UC Course  I have reviewed the triage vital signs and the nursing notes.  Pertinent labs & imaging results that were available during my care of the patient were reviewed by me and considered in my medical decision making (see chart for details).    Acute sinusitis.  Patient has been symptomatic for 1 week.  He is not improving with OTC treatment.  Treating today with amoxicillin.  Tylenol or ibuprofen as needed.  Mucinex as needed.   Instructed patient to follow up with his PCP if his symptoms are not improving.  He agrees to plan  of care.    Final Clinical Impressions(s) / UC Diagnoses   Final diagnoses:  Acute non-recurrent maxillary sinusitis     Discharge Instructions      Take the amoxicillin as directed.  Follow up with your primary care provider if your symptoms are not improving.        ED Prescriptions     Medication Sig Dispense Auth. Provider   amoxicillin (AMOXIL) 875 MG tablet Take 1 tablet (875 mg total) by mouth 2 (two) times daily for 10 days. 20 tablet Mickie Bail, NP      PDMP not reviewed this encounter.   Mickie Bail, NP 10/13/22 [id number]

## 2022-10-13 NOTE — ED Triage Notes (Signed)
Patient to Urgent Care with complaints of  generalized body aches and nasal congestion, x6 days. Reports some chills, unknown fevers.   Patient was seen and evaluated last week but has continued to feel worse. Negative for covid and flu.

## 2022-10-13 NOTE — Discharge Instructions (Addendum)
Take the amoxicillin as directed.  Follow up with your primary care provider if your symptoms are not improving.   ° ° °

## 2022-12-23 ENCOUNTER — Ambulatory Visit
Admission: EM | Admit: 2022-12-23 | Discharge: 2022-12-23 | Disposition: A | Discharge status: Home / Self Care | Payer: BC Managed Care – PPO | Attending: Emergency Medicine | Admitting: Emergency Medicine

## 2022-12-23 ENCOUNTER — Ambulatory Visit: Admit: 2022-12-23 | Discharge status: Against Medical Advice | Payer: BC Managed Care – PPO

## 2022-12-23 DIAGNOSIS — U071 COVID-19: Secondary | ICD-10-CM | POA: Diagnosis not present

## 2022-12-23 LAB — RESP PANEL BY RT-PCR (RSV, FLU A&B, COVID)  RVPGX2
Influenza A by PCR: NEGATIVE
Influenza B by PCR: NEGATIVE
Resp Syncytial Virus by PCR: NEGATIVE
SARS Coronavirus 2 by RT PCR: POSITIVE — AB

## 2022-12-23 MED ORDER — IPRATROPIUM BROMIDE 0.06 % NA SOLN: 2.0000 | Freq: Four times a day (QID) | NASAL | 12 refills | Status: DC

## 2022-12-23 MED ORDER — PROMETHAZINE-DM 6.25-15 MG/5ML PO SYRP: 5.0000 mL | ORAL_SOLUTION | Freq: Four times a day (QID) | ORAL | 0 refills | Status: DC | PRN

## 2022-12-23 MED ORDER — BENZONATATE 100 MG PO CAPS
200.0000 mg | ORAL_CAPSULE | Freq: Three times a day (TID) | ORAL | 0 refills | Status: DC
Start: 2022-12-23 — End: 2023-02-09

## 2022-12-23 NOTE — ED Provider Notes (Signed)
[facility]    CSN: [id number] Arrival date & time: 12/23/22  1405      History   Chief Complaint Chief Complaint  Patient presents with   Headache   Fever   Generalized Body Aches    HPI Jeffrey Francis is a 47 y.o. male.   HPI  47 year old male here for evaluation of fever.  Patient reports that his fever began yesterday and he had a Tmax of 101.  There is also associated runny nose and nasal congestion for clear nasal discharge, headache, body aches, and a nonproductive cough.  He denies any sore throat, ear pain, shortness of breath, or wheezing.  He was exposed to COVID approximately 2 weeks ago and both of his grandsons recently had influenza B.  Past Medical History:  Diagnosis Date   Allergy    sinus problems   Anxiety    GERD (gastroesophageal reflux disease)    RARE-NO MEDS   Headache    MIGRAINES   Tachycardia     Patient Active Problem List   Diagnosis Date Noted   Recurrent ventral hernia     Past Surgical History:  Procedure Laterality Date   HERNIA REPAIR     ROBOTIC ASSISTED LAPAROSCOPIC VENTRAL/INCISIONAL HERNIA REPAIR N/A 08/13/2018   Procedure: ROBOTIC ASSISTED LAPAROSCOPIC VENTRAL/INCISIONAL HERNIA REPAIR;  Surgeon: Jules Husbands, MD;  Location: [facility] ORS;  Service: General;  Laterality: N/A;   UMBILICAL HERNIA REPAIR  2016       Home Medications    Prior to Admission medications   Medication Sig Start Date End Date Taking? Authorizing Provider  Ascorbic Acid (VITAMIN C) 1000 MG tablet Take 1,000 mg by mouth every evening.    Yes [provider]  b complex vitamins tablet Take 1 tablet by mouth daily.   Yes [provider]  benzonatate (TESSALON) 100 MG capsule Take 2 capsules (200 mg total) by mouth every 8 (eight) hours. 12/23/22  Yes Margarette Canada, NP  fluticasone (FLONASE) 50 MCG/ACT nasal spray Place 2 sprays into both nostrils daily. 08/10/22  Yes Duanne Guess, PA-C  ibuprofen (ADVIL,MOTRIN) 200 MG  tablet Take 800 mg by mouth every 8 (eight) hours as needed (for pain.).   Yes [provider]  ipratropium (ATROVENT) 0.06 % nasal spray Place 2 sprays into both nostrils 4 (four) times daily. 12/23/22  Yes Margarette Canada, NP  loratadine (CLARITIN) 10 MG tablet Take 10 mg by mouth every morning.    Yes [provider]  Multiple Vitamin (MULTIVITAMIN WITH MINERALS) TABS tablet Take 1 tablet by mouth daily.   Yes [provider]  naproxen sodium (ALEVE) 220 MG tablet Take 660 mg by mouth 2 (two) times daily as needed (for pain.).   Yes [provider]  promethazine-dextromethorphan (PROMETHAZINE-DM) 6.25-15 MG/5ML syrup Take 5 mLs by mouth 4 (four) times daily as needed. 12/23/22  Yes Margarette Canada, NP    Family History History reviewed. No pertinent family history.  Social History Social History   Tobacco Use   Smoking status: Never   Smokeless tobacco: Never  Vaping Use   Vaping Use: Never used  Substance Use Topics   Alcohol use: Yes    Alcohol/week: 2.0 - 4.0 standard drinks of alcohol    Types: 2 - 4 Standard drinks or equivalent per week   Drug use: Never     Allergies   Patient has no known allergies.   Review of Systems Review of Systems  Constitutional:  Positive for  fever.  HENT:  Positive for congestion and rhinorrhea. Negative for ear pain and sore throat.   Respiratory:  Positive for cough. Negative for shortness of breath and wheezing.   Musculoskeletal:  Positive for arthralgias and myalgias.  Neurological:  Positive for headaches.  Hematological: Negative.   Psychiatric/Behavioral: Negative.       Physical Exam Triage Vital Signs ED Triage Vitals  Enc Vitals Group     BP 12/23/22 1417 (!) 130/96     Pulse Rate 12/23/22 1417 (!) 123     Resp 12/23/22 1417 16     Temp 12/23/22 1417 98.9 F (37.2 C)     Temp Source 12/23/22 1417 Oral     SpO2 12/23/22 1417 95 %     Weight 12/23/22 1417 250 lb (113.4 kg)     Height  12/23/22 1417 5' 9"$  (1.753 m)     Head Circumference --      Peak Flow --      Pain Score 12/23/22 1416 4     Pain Loc --      Pain Edu? --      Excl. in [place]? --    No data found.  Updated Vital Signs BP (!) 130/96 (BP Location: Left Arm)   Pulse (!) 123   Temp 98.9 F (37.2 C) (Oral)   Resp 16   Ht 5' 9"$  (1.753 m)   Wt 250 lb (113.4 kg)   SpO2 95%   BMI 36.92 kg/m   Visual Acuity Right Eye Distance:   Left Eye Distance:   Bilateral Distance:    Right Eye Near:   Left Eye Near:    Bilateral Near:     Physical Exam Vitals and nursing note reviewed.  Constitutional:      Appearance: Normal appearance. He is ill-appearing.  HENT:     Head: Normocephalic and atraumatic.     Right Ear: Tympanic membrane, ear canal and external ear normal. There is no impacted cerumen.     Left Ear: Tympanic membrane, ear canal and external ear normal. There is no impacted cerumen.     Nose: Congestion and rhinorrhea present.     Comments: Nasal mucosa is erythematous and edematous with copious clear discharge in both nares.    Mouth/Throat:     Mouth: Mucous membranes are moist.     Pharynx: Oropharynx is clear. Posterior oropharyngeal erythema present. No oropharyngeal exudate.     Comments: Posterior pharynx has erythema and injection with clear postnasal drip. Cardiovascular:     Rate and Rhythm: Normal rate and regular rhythm.     Pulses: Normal pulses.     Heart sounds: Normal heart sounds. No murmur heard.    No friction rub. No gallop.  Pulmonary:     Effort: Pulmonary effort is normal.     Breath sounds: Normal breath sounds. No wheezing, rhonchi or rales.  Musculoskeletal:     Cervical back: Normal range of motion and neck supple.  Lymphadenopathy:     Cervical: No cervical adenopathy.  Skin:    General: Skin is warm and dry.     Capillary Refill: Capillary refill takes less than 2 seconds.     Findings: No erythema or rash.  Neurological:     General: No focal deficit  present.     Mental Status: He is alert and oriented to person, place, and time.  Psychiatric:        Mood and Affect: Mood normal.  Behavior: Behavior normal.        Thought Content: Thought content normal.        Judgment: Judgment normal.      UC Treatments / Results  Labs (all labs ordered are listed, but only abnormal results are displayed) Labs Reviewed  RESP PANEL BY RT-PCR (RSV, FLU A&B, COVID)  RVPGX2 - Abnormal; Notable for the following components:      Result Value   SARS Coronavirus 2 by RT PCR POSITIVE (*)    All other components within normal limits    EKG   Radiology No results found.  Procedures Procedures (including critical care time)  Medications Ordered in UC Medications - No data to display  Initial Impression / Assessment and Plan / UC Course  I have reviewed the triage vital signs and the nursing notes.  Pertinent labs & imaging results that were available during my care of the patient were reviewed by me and considered in my medical decision making (see chart for details).   Patient is a pleasant, though mildly ill-appearing 47 year old male here for evaluation of flulike symptoms that been going on since yesterday as outlined in HPI above.  He does have inflamed nasal mucosa with clear nasal discharge.  There is also clear postnasal drip with mild erythema to the posterior oropharynx.  Cardiopulmonary exam reveals clear lung sounds in all fields.  He was recent exposed to both COVID and influenza so I will send PCR is looking for both.  Given his fever I suspect that the patient is influenza.  Respiratory panel is positive for COVID-19.  I will discharge patient home with a diagnosis of COVID-19 and have him quarantine for 5 days from onset of his symptoms.  He can use over-the-counter Tylenol and/or ibuprofen according to package instructions as needed for fever and pain.  I will prescribe Atrovent nasal spray for the nasal congestion along  with Tessalon Perles and Promethazine DM cough syrup to help with cough and congestion.  ER and return precautions reviewed.   Final Clinical Impressions(s) / UC Diagnoses   Final diagnoses:  [id number]     Discharge Instructions      You have tested positive for COVID-19 today.  You will need to quarantine for 5 days from onset of your symptoms.  After 5 days you can break quarantine if your symptoms have improved and you have not had a fever for 24 hours without taking Tylenol and/or ibuprofen.  You must wear a mask around other people for an additional 5 days.  Take over-the-counter Tylenol and/or ibuprofen according to package instructions as needed for fever and pain.  Use the Atrovent nasal spray, 2 squirts up each nostril every 6 hours, as needed for nasal congestion and drainage.  Use the Tessalon Perles every 8 hours during the day as needed for cough.  Take them with a small sip of water.  You may get some numbness to the base of your tongue or metallic taste in your mouth, this is normal.  Use the Promethazine DM cough syrup at night as needed for cough and congestion.  This medication will make you drowsy.  If you develop any shortness of breath, especially at rest, feel as though you cannot catch your breath, you are unable to speak in full sentences, or your lips begin turning blue you need to call 911 and go to the ER.     ED Prescriptions     Medication Sig Dispense Auth. Provider   benzonatate (  TESSALON) 100 MG capsule Take 2 capsules (200 mg total) by mouth every 8 (eight) hours. 21 capsule Margarette Canada, NP   ipratropium (ATROVENT) 0.06 % nasal spray Place 2 sprays into both nostrils 4 (four) times daily. 15 mL Margarette Canada, NP   promethazine-dextromethorphan (PROMETHAZINE-DM) 6.25-15 MG/5ML syrup Take 5 mLs by mouth 4 (four) times daily as needed. 118 mL Margarette Canada, NP      PDMP not reviewed this encounter.   Margarette Canada, NP 12/23/22 1510

## 2022-12-23 NOTE — Discharge Instructions (Addendum)
You have tested positive for COVID-19 today.  You will need to quarantine for 5 days from onset of your symptoms.  After 5 days you can break quarantine if your symptoms have improved and you have not had a fever for 24 hours without taking Tylenol and/or ibuprofen.  You must wear a mask around other people for an additional 5 days.  Take over-the-counter Tylenol and/or ibuprofen according to package instructions as needed for fever and pain.  Use the Atrovent nasal spray, 2 squirts up each nostril every 6 hours, as needed for nasal congestion and drainage.  Use the Tessalon Perles every 8 hours during the day as needed for cough.  Take them with a small sip of water.  You may get some numbness to the base of your tongue or metallic taste in your mouth, this is normal.  Use the Promethazine DM cough syrup at night as needed for cough and congestion.  This medication will make you drowsy.  If you develop any shortness of breath, especially at rest, feel as though you cannot catch your breath, you are unable to speak in full sentences, or your lips begin turning blue you need to call 911 and go to the ER.

## 2022-12-23 NOTE — ED Triage Notes (Signed)
Pt c/o fever,HA & bodyaches x1 day.

## 2023-02-09 ENCOUNTER — Ambulatory Visit
Admission: RE | Admit: 2023-02-09 | Discharge: 2023-02-09 | Disposition: A | Discharge status: Home / Self Care | Payer: BC Managed Care – PPO | Source: Ambulatory Visit | Attending: Urgent Care | Admitting: Urgent Care

## 2023-02-09 VITALS — BP 147/88 | HR 110 | Temp 98.8°F | Resp 17

## 2023-02-09 DIAGNOSIS — R6889 Other general symptoms and signs: Secondary | ICD-10-CM | POA: Diagnosis not present

## 2023-02-09 LAB — POCT INFLUENZA A/B
Influenza A, POC: NEGATIVE
Influenza B, POC: NEGATIVE

## 2023-02-09 MED ORDER — BENZONATATE 100 MG PO CAPS: ORAL_CAPSULE | ORAL | 0 refills | Status: DC

## 2023-02-09 MED ORDER — PROMETHAZINE-DM 6.25-15 MG/5ML PO SYRP: 5.0000 mL | ORAL_SOLUTION | Freq: Four times a day (QID) | ORAL | 0 refills | Status: AC | PRN

## 2023-02-09 NOTE — ED Triage Notes (Signed)
Cough, congestion, body aches, chills, fatigue, chest pain from coughing that started 3 days ago. Using Flonase and Claritin with no relief of symptoms.

## 2023-02-09 NOTE — ED Provider Notes (Signed)
Jeffrey Francis    CSN: [id number] Arrival date & time: 02/09/23  1632      History   Chief Complaint Chief Complaint  Patient presents with   Nasal Congestion    Cough, achy - Entered by patient    HPI Jeffrey Francis is a 47 y.o. male.   HPI  Presents to urgent care with symptoms starting 3 days ago and acutely worsening yesterday.  He endorses cough starting 3 days ago, followed by congestion, body aches, chills, fatigue, chest pain from frequent cough.  Past Medical History:  Diagnosis Date   Allergy    sinus problems   Anxiety    GERD (gastroesophageal reflux disease)    RARE-NO MEDS   Headache    MIGRAINES   Tachycardia     Patient Active Problem List   Diagnosis Date Noted   Recurrent ventral hernia     Past Surgical History:  Procedure Laterality Date   HERNIA REPAIR     ROBOTIC ASSISTED LAPAROSCOPIC VENTRAL/INCISIONAL HERNIA REPAIR N/A 08/13/2018   Procedure: ROBOTIC ASSISTED LAPAROSCOPIC VENTRAL/INCISIONAL HERNIA REPAIR;  Surgeon: Jules Husbands, MD;  Location: [facility] ORS;  Service: General;  Laterality: N/A;   UMBILICAL HERNIA REPAIR  2016       Home Medications    Prior to Admission medications   Medication Sig Start Date End Date Taking? Authorizing Provider  Ascorbic Acid (VITAMIN C) 1000 MG tablet Take 1,000 mg by mouth every evening.    Yes [provider]  b complex vitamins tablet Take 1 tablet by mouth daily.   Yes [provider]  fluticasone (FLONASE) 50 MCG/ACT nasal spray Place 2 sprays into both nostrils daily. 08/10/22  Yes Duanne Guess, PA-C  ibuprofen (ADVIL,MOTRIN) 200 MG tablet Take 800 mg by mouth every 8 (eight) hours as needed (for pain.).   Yes [provider]  ipratropium (ATROVENT) 0.06 % nasal spray Place 2 sprays into both nostrils 4 (four) times daily. 12/23/22  Yes Margarette Canada, NP  loratadine (CLARITIN) 10 MG tablet Take 10 mg by mouth every morning.    Yes [provider]   Multiple Vitamin (MULTIVITAMIN WITH MINERALS) TABS tablet Take 1 tablet by mouth daily.   Yes [provider]  naproxen sodium (ALEVE) 220 MG tablet Take 660 mg by mouth 2 (two) times daily as needed (for pain.).   Yes [provider]  benzonatate (TESSALON) 100 MG capsule Take 2 capsules (200 mg total) by mouth every 8 (eight) hours. 12/23/22   Margarette Canada, NP  promethazine-dextromethorphan (PROMETHAZINE-DM) 6.25-15 MG/5ML syrup Take 5 mLs by mouth 4 (four) times daily as needed. 12/23/22   Margarette Canada, NP    Family History History reviewed. No pertinent family history.  Social History Social History   Tobacco Use   Smoking status: Never   Smokeless tobacco: Never  Vaping Use   Vaping Use: Never used  Substance Use Topics   Alcohol use: Yes    Alcohol/week: 2.0 - 4.0 standard drinks of alcohol    Types: 2 - 4 Standard drinks or equivalent per week   Drug use: Never     Allergies   Patient has no known allergies.   Review of Systems Review of Systems   Physical Exam Triage Vital Signs ED Triage Vitals [02/09/23 1703]  Enc Vitals Group     BP (!) 147/88     Pulse Rate (!) 110     Resp 17     Temp 98.8 F (  37.1 C)     Temp Source Oral     SpO2 98 %     Weight      Height      Head Circumference      Peak Flow      Pain Score 3     Pain Loc      Pain Edu?      Excl. in [place]?    No data found.  Updated Vital Signs BP (!) 147/88 (BP Location: Right Arm)   Pulse (!) 110   Temp 98.8 F (37.1 C) (Oral)   Resp 17   SpO2 98%   Visual Acuity Right Eye Distance:   Left Eye Distance:   Bilateral Distance:    Right Eye Near:   Left Eye Near:    Bilateral Near:     Physical Exam Vitals reviewed.  Constitutional:      Appearance: Normal appearance.  HENT:     Nose: Congestion present.     Mouth/Throat:     Mouth: Mucous membranes are moist.     Pharynx: Posterior oropharyngeal erythema present. No oropharyngeal exudate.   Cardiovascular:     Rate and Rhythm: Normal rate and regular rhythm.     Pulses: Normal pulses.     Heart sounds: Normal heart sounds.  Pulmonary:     Effort: Pulmonary effort is normal.     Breath sounds: Normal breath sounds.  Skin:    General: Skin is warm and dry.  Neurological:     General: No focal deficit present.     Mental Status: He is alert and oriented to person, place, and time.  Psychiatric:        Mood and Affect: Mood normal.        Behavior: Behavior normal.      UC Treatments / Results  Labs (all labs ordered are listed, but only abnormal results are displayed) Labs Reviewed - No data to display  EKG   Radiology No results found.  Procedures Procedures (including critical care time)  Medications Ordered in UC Medications - No data to display  Initial Impression / Assessment and Plan / UC Course  I have reviewed the triage vital signs and the nursing notes.  Pertinent labs & imaging results that were available during my care of the patient were reviewed by me and considered in my medical decision making (see chart for details).   Patient is afebrile here without recent antipyretics. Satting well on room air. Overall is ill appearing, well hydrated, without respiratory distress. Pulmonary exam is unremarkable.  Lungs CTAB without wheezing, rhonchi, rales.  Mild pharyngeal erythema without peritonsillar exudates.  Patient's symptoms are consistent with an acute viral process.  POC influenza is negative.  Will treat his most acute symptom with benzonatate and Promethazine DM.  Otherwise, recommending patient continue to use OTC medication for symptom control.  Final Clinical Impressions(s) / UC Diagnoses   Final diagnoses:  None   Discharge Instructions   None    ED Prescriptions   None    PDMP not reviewed this encounter.   Jeffrey Francis, [place] 02/09/23 1732

## 2023-02-09 NOTE — Discharge Instructions (Addendum)
Follow up here or with your primary care provider if your symptoms are worsening or not improving.     

## 2023-12-07 ENCOUNTER — Ambulatory Visit: Payer: BC Managed Care – PPO | Admitting: Allergy & Immunology

## 2023-12-20 ENCOUNTER — Encounter: Payer: Self-pay | Admitting: Allergy & Immunology

## 2023-12-20 ENCOUNTER — Other Ambulatory Visit: Payer: Self-pay

## 2023-12-20 ENCOUNTER — Ambulatory Visit (INDEPENDENT_AMBULATORY_CARE_PROVIDER_SITE_OTHER): Payer: BC Managed Care – PPO | Admitting: Allergy & Immunology

## 2023-12-20 VITALS — BP 150/82 | HR 91 | Temp 98.6°F | Resp 18 | Ht <= 58 in | Wt 251.9 lb

## 2023-12-20 DIAGNOSIS — J330 Polyp of nasal cavity: Secondary | ICD-10-CM

## 2023-12-20 DIAGNOSIS — J310 Chronic rhinitis: Secondary | ICD-10-CM

## 2023-12-20 DIAGNOSIS — L508 Other urticaria: Secondary | ICD-10-CM

## 2023-12-20 MED ORDER — LEVOCETIRIZINE DIHYDROCHLORIDE 5 MG PO TABS: ORAL_TABLET | ORAL | 5 refills | Status: DC

## 2023-12-20 MED ORDER — CETIRIZINE HCL 10 MG PO TABS: ORAL_TABLET | ORAL | 5 refills | Status: DC

## 2023-12-20 MED ORDER — FAMOTIDINE 20 MG PO TABS: 20.0000 mg | ORAL_TABLET | Freq: Two times a day (BID) | ORAL | 5 refills | Status: DC

## 2023-12-20 NOTE — Progress Notes (Signed)
 NEW PATIENT  Date of Service/Encounter:  12/20/23  Consult requested by: Pcp, No   Assessment:   Chronic urticaria - getting labs (submitting for Xolair )  Chronic rhinitis - planning for skin testing at the next visit  Right sided nasal polyp  Plan/Recommendations:   1. Chronic urticaria - Your history does not have any red flags such as fevers, joint pains, or permanent skin changes that would be concerning for a more serious cause of hives.  - We will get some labs to rule out serious causes of hives: alpha gal panel, complete blood count, tryptase level, chronic urticaria panel, CMP, ESR, and CRP. - Chronic hives are often times a self limited process and will burn themselves out over 6-12 months, although this is not always the case.  - In the meantime, start suppressive dosing of antihistamines:   - Morning: Xyzal  (levocetirizine) 10mg  (two tablets) + Pepcid  (famotidine ) 20mg   - Evening: Zyrtec  (cetirizine ) 20mg  (two tablets) + Pepcid  (famotidine ) 20mg  - You can change this dosing at home, decreasing the dose as needed or increasing the dosing as needed.  - If you are not tolerating the medications or are tired of taking them every day, we can start treatment with a monthly injectable medication called Xolair .  - Information on Xolair : [email or web address] [email or web address]  2. Chronic rhinitis - Because of insurance stipulations, we cannot do skin testing on the same day as your first visit. - We are all working to fight this, but for now we need to do two separate visits.  - We will know more after we do testing at the next visit.  - The skin testing visit can be squeezed in at your convenience.  - Then we can make a more full plan to address all of your symptoms. - Be sure to stop your antihistamines for 3 days before this appointment.    3. Return in about 1 week (around 12/27/2023). You can have the follow up appointment with Dr. Iva or a Nurse Practicioner (our Nurse  Practitioners are excellent and always have Physician oversight!).     This note in its entirety was forwarded to the Provider who requested this consultation.  Subjective:   Jeffrey Francis is a 48 y.o. male presenting today for evaluation of  Chief Complaint  Patient presents with   Angioedema   Pruritus   Urticaria    Jeffrey Francis has a history of the following: Patient Active Problem List   Diagnosis Date Noted   Recurrent ventral hernia     History obtained from: chart review and patient.  Discussed the use of AI scribe software for clinical note transcription with the patient and/or guardian, who gave verbal consent to proceed.  Jeffrey Francis was referred by Pcp, No.     Jeffrey Francis is a 48 y.o. male presenting for an evaluation of urticaria .  Jeffrey Francis is a 48 year old male who presents with chronic hives and swelling. He was referred by his primary doctor to a dermatologist for further evaluation.  He has been experiencing chronic hives and swelling that began last year. Initially, the hives appeared in the morning and resolved by midday, but they have since become persistent and severe, affecting various parts of his body. He describes the hives as starting with achy and itchy hands, followed by itching on his feet, back of his legs, and other areas. His left foot swells and feels sunburned, and his lips occasionally swell.  He has tried  various treatments and identified potential triggers without success. Benadryl  has been ineffective, and he has attempted eliminating vitamins and foods with red dye, as well as sugar, suspecting a link to sweets like cheesecake or ice cream. Despite stopping all vitamins a few months ago, the hives persist. He has been prescribed prednisone  and Zyrtec , which temporarily alleviated symptoms when taken in high doses. He uses leftover prednisone  when symptoms become severe.  He has undergone extensive testing, including blood tests. A  dermatologist performed a skin biopsy and suggested a possible drug reaction, although he was not taking any new medications at the time. He recalls a similar issue as a teenager, where exposure to cold followed by warmth caused a painful rash, but tests at that time were also negative.  No throat swelling, stomach pain, or asthma. He has not had recent issues with environmental allergies. He reports a history of pneumonia twice in one year in 2010, but no recent respiratory issues.  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 There is no known family history of autoimmune diseases such as lupus or rheumatoid arthritis.     Otherwise, there is no history of other atopic diseases, including drug allergies, stinging insect allergies, or contact dermatitis. There is no significant infectious history. Vaccinations are up to date.    Past Medical History: Patient Active Problem List   Diagnosis Date Noted   Recurrent ventral hernia     Medication List:  Allergies as of 12/20/2023       Reactions   Amoxicillin -pot Clavulanate Hives   Pt stated he is itching worse now        Medication List        Accurate as of December 20, 2023  2:49 PM. If you have any questions, ask your nurse or doctor.          b complex vitamins tablet Take 1 tablet by mouth daily.   benzonatate  100 MG capsule Commonly known as: TESSALON  Take 1-2 tablets 3 times a day as needed for cough   fluticasone  50 MCG/ACT nasal spray Commonly known as: FLONASE  Place 2 sprays into both nostrils daily.   hydrOXYzine 25 MG tablet Commonly known as: ATARAX TAKE 1 TABLET BY MOUTH NIGHTLY AS NEEDED FOR ITCHING   ibuprofen 200 MG tablet Commonly known as: ADVIL Take 800 mg by mouth every 8 (eight) hours as needed (for pain.).   ipratropium 0.06 % nasal spray Commonly known as: ATROVENT  Place 2 sprays into both nostrils 4 (four) times daily.    loratadine 10 MG tablet Commonly known as: CLARITIN Take 10 mg by mouth every morning.   multivitamin with minerals Tabs tablet Take 1 tablet by mouth daily.   naproxen sodium 220 MG tablet Commonly known as: ALEVE Take 660 mg by mouth 2 (two) times daily as needed (for pain.).   promethazine -dextromethorphan 6.25-15 MG/5ML syrup Commonly known as: PROMETHAZINE -DM Take 5 mLs by mouth 4 (four) times daily as needed for cough.   vitamin C 1000 MG tablet Take 1,000 mg by mouth every evening.   Wegovy 0.25 MG/0.5ML Soaj Generic drug: Semaglutide-Weight Management SMARTSIG:0.5 Milliliter(s) SUB-Q Once a Week        Birth History: non-contributory  Developmental History: non-contributory  Past Surgical History: Past Surgical History:  Procedure Laterality Date   ADENOIDECTOMY     HERNIA REPAIR     ROBOTIC ASSISTED LAPAROSCOPIC VENTRAL/INCISIONAL HERNIA REPAIR N/A 08/13/2018  Procedure: ROBOTIC ASSISTED LAPAROSCOPIC VENTRAL/INCISIONAL HERNIA REPAIR;  Surgeon: Jordis Laneta FALCON, MD;  Location: [facility] ORS;  Service: General;  Laterality: N/A;   TONSILLECTOMY     TYMPANOSTOMY TUBE PLACEMENT     UMBILICAL HERNIA REPAIR  2016     Family History: Family History  Problem Relation Age of Onset   Diabetes Mother    Diabetes Father      Social History: Jeffrey Francis lives at home with his family.  He lives in a house that is 46 years old.  There is laminate in the main living areas and carpeting in the bedroom.  He has electric heating and central cooling.  There are dogs inside the home as well as the bedroom.  There are dust mite covers on the bed, but not the pillows.  There is no tobacco exposure.  He currently works in [employer] in the [employer]. There are no fumes, chemicals, or dust exposures in the home. There is a HEPA filter in the home.    Review of systems otherwise negative other than that mentioned in the HPI.    Objective:   Blood pressure (!)  150/82, pulse 91, temperature 98.6 F (37 C), temperature source Temporal, resp. rate 18, height 4' 9.5 (1.461 m), weight 251 lb 14.4 oz (114.3 kg), SpO2 97%. Body mass index is 53.57 kg/m.     Physical Exam Constitutional:      Appearance: He is well-developed.  HENT:     Head: Normocephalic and atraumatic.     Right Ear: Tympanic membrane, ear canal and external ear normal. No drainage, swelling or tenderness. Tympanic membrane is not injected, scarred, erythematous, retracted or bulging.     Left Ear: Tympanic membrane, ear canal and external ear normal. No drainage, swelling or tenderness. Tympanic membrane is not injected, scarred, erythematous, retracted or bulging.     Nose: No nasal deformity, septal deviation, mucosal edema or rhinorrhea.     Right Turbinates: Enlarged, swollen and pale.     Left Turbinates: Enlarged, swollen and pale.     Right Sinus: No maxillary sinus tenderness or frontal sinus tenderness.     Left Sinus: No maxillary sinus tenderness or frontal sinus tenderness.     Comments: Right sided nasal polyp present obstructing around 75% of the nasal cavity.    Mouth/Throat:     Mouth: Mucous membranes are not pale and not dry.     Pharynx: Uvula midline.  Eyes:     General:        Right eye: No discharge.        Left eye: No discharge.     Conjunctiva/sclera: Conjunctivae normal.     Right eye: Right conjunctiva is not injected. No chemosis.    Left eye: Left conjunctiva is not injected. No chemosis.    Pupils: Pupils are equal, round, and reactive to light.  Cardiovascular:     Rate and Rhythm: Normal rate and regular rhythm.     Heart sounds: Normal heart sounds.  Pulmonary:     Effort: Pulmonary effort is normal. No tachypnea, accessory muscle usage or respiratory distress.     Breath sounds: Normal breath sounds. No wheezing, rhonchi or rales.  Chest:     Chest wall: No tenderness.  Abdominal:     Tenderness: There is no abdominal tenderness.  There is no guarding or rebound.  Lymphadenopathy:     Head:     Right side of head: No submandibular, tonsillar or occipital adenopathy.  Left side of head: No submandibular, tonsillar or occipital adenopathy.     Cervical: No cervical adenopathy.  Skin:    General: Skin is warm.     Capillary Refill: Capillary refill takes less than 2 seconds.     Coloration: Skin is not pale.     Findings: Rash present. No abrasion, erythema or petechiae. Rash is urticarial. Rash is not papular or vesicular.     Comments: Some isolated urticaria on the arms.  Neurological:     Mental Status: He is alert.      Diagnostic studies: labs sent instead       Jeffrey Shaggy, MD [facility] 

## 2023-12-20 NOTE — Patient Instructions (Addendum)
 1. Chronic urticaria (Primary) - Your history does not have any red flags such as fevers, joint pains, or permanent skin changes that would be concerning for a more serious cause of hives.  - We will get some labs to rule out serious causes of hives: alpha gal panel, complete blood count, tryptase level, chronic urticaria panel, CMP, ESR, and CRP. - Chronic hives are often times a self limited process and will burn themselves out over 6-12 months, although this is not always the case.  - In the meantime, start suppressive dosing of antihistamines:   - Morning: Xyzal  (levocetirizine) 10mg  (two tablets) + Pepcid  (famotidine ) 20mg   - Evening: Zyrtec  (cetirizine ) 20mg  (two tablets) + Pepcid  (famotidine ) 20mg  - You can change this dosing at home, decreasing the dose as needed or increasing the dosing as needed.  - If you are not tolerating the medications or are tired of taking them every day, we can start treatment with a monthly injectable medication called Xolair .  - Information on Xolair : [email or web address] [email or web address]  2. Chronic rhinitis - Because of insurance stipulations, we cannot do skin testing on the same day as your first visit. - We are all working to fight this, but for now we need to do two separate visits.  - We will know more after we do testing at the next visit.  - The skin testing visit can be squeezed in at your convenience.  - Then we can make a more full plan to address all of your symptoms. - Be sure to stop your antihistamines for 3 days before this appointment.    3. Return in about 1 week (around 12/27/2023). You can have the follow up appointment with Dr. Iva or a Nurse Practicioner (our Nurse Practitioners are excellent and always have Physician oversight!).    Please inform us  of any Emergency Department visits, hospitalizations, or changes in symptoms. Call us  before going to the ED for breathing or allergy  symptoms since we might be able to fit you in for a sick  visit. Feel free to contact us  anytime with any questions, problems, or concerns.  It was a pleasure to meet you today!  Websites that have reliable patient information: 1. American Academy of Asthma, Allergy , and Immunology: www.aaaai.org 2. Food Allergy  Research and Education (FARE): foodallergy.org 3. Mothers of Asthmatics: http://www.asthmacommunitynetwork.org 4. American College of Allergy , Asthma, and Immunology: www.acaai.org      "Like" us  on Facebook and Instagram for our latest updates!      A healthy democracy works best when [employer] participate! Make sure you are registered to vote! If you have moved or changed any of your contact information, you will need to get this updated before voting! Scan the QR codes below to learn more!

## 2023-12-22 LAB — ALPHA-GAL PANEL
Allergen Lamb IgE: <0.1 kU/L
Beef IgE: <0.1 kU/L
IgE (Immunoglobulin E), Serum: 110 [IU]/mL (ref 6–495)
O215-IgE Alpha-Gal: <0.1 kU/L
Pork IgE: <0.1 kU/L

## 2023-12-27 ENCOUNTER — Other Ambulatory Visit: Payer: Self-pay

## 2023-12-27 ENCOUNTER — Other Ambulatory Visit: Payer: Self-pay | Admitting: Allergy & Immunology

## 2023-12-27 ENCOUNTER — Ambulatory Visit: Payer: BC Managed Care – PPO | Admitting: Allergy & Immunology

## 2023-12-27 ENCOUNTER — Telehealth: Payer: Self-pay | Admitting: *Deleted

## 2023-12-27 ENCOUNTER — Encounter: Payer: Self-pay | Admitting: Allergy & Immunology

## 2023-12-27 DIAGNOSIS — J310 Chronic rhinitis: Secondary | ICD-10-CM

## 2023-12-27 DIAGNOSIS — L508 Other urticaria: Secondary | ICD-10-CM

## 2023-12-27 DIAGNOSIS — J330 Polyp of nasal cavity: Secondary | ICD-10-CM | POA: Diagnosis not present

## 2023-12-27 MED ORDER — OMALIZUMAB 300 MG/2  ML ~~LOC~~ SOSY
300.0000 mg | PREFILLED_SYRINGE | Freq: Once | SUBCUTANEOUS | Status: AC
  Administered 2023-12-27: 300 mg via SUBCUTANEOUS

## 2023-12-27 MED ORDER — EPINEPHRINE 0.3 MG/0.3ML IJ SOAJ: 0.3000 mg | INTRAMUSCULAR | 1 refills | Status: DC | PRN

## 2023-12-27 NOTE — Patient Instructions (Addendum)
1. Chronic urticaria  - Most of your labs looked good, but you did have an elevated ANA, which is marker of autoimmune disease. - The rest of the labs were normal.  - We could send you to Rheumatology if you are interested, but I am not convinced that they would be excited about it. - I would strongly consider the Xolair. - Testing today was negative to the entire environmental panel and the most common foods.  - We talked about this last time AND it can help with nasal polyps as well. - In the meantime, continue with suppressive dosing of antihistamines:   - Morning: Xyzal (levocetirizine) 10mg  (two tablets) + Pepcid (famotidine) 20mg   - Evening: Zyrtec (cetirizine) 20mg  (two tablets) + Pepcid (famotidine) 20mg  - You can change this dosing at home, decreasing the dose as needed or increasing the dosing as needed.  - If you are not tolerating the medications or are tired of taking them every day, we can start treatment with a monthly injectable medication called Xolair.  - Information on Xolair: [email or web address]-[email or web address]  2. Chronic rhinitis - Testing today showed: NEGATIVE TO THE ENTIRE PANEL - Copy of test results provided.  - Continue with: antihistamines as above  3. Return in about 3 months (around 03/25/2024). You can have the follow up appointment with Dr. Dellis Anes or a Nurse Practicioner (our Nurse Practitioners are excellent and always have Physician oversight!).    Please inform us of any Emergency Department visits, hospitalizations, or changes in symptoms. Call us before going to the ED for breathing or allergy symptoms since we might be able to fit you in for a sick visit. Feel free to contact us anytime with any questions, problems, or concerns.  It was a pleasure to see you again today!  Websites that have reliable patient information: 1. American Academy of Asthma, Allergy, and Immunology: www.aaaai.org 2. Food Allergy Research and Education (FARE): foodallergy.org 3.  Mothers of Asthmatics: http://www.asthmacommunitynetwork.org 4. American College of Allergy, Asthma, and Immunology: www.acaai.org      "Like" [place] on Facebook and Instagram for our latest updates!      A healthy democracy works best when [employer] participate! Make sure you are registered to vote! If you have moved or changed any of your contact information, you will need to get this updated before voting! Scan the QR codes below to learn more!        Airborne Adult Perc - 12/27/23 0857     Time Antigen Placed 0857    Allergen Manufacturer Waynette Buttery    Location Back    Number of Test 55    Panel 1 Select    1. Control-Buffer 50% Glycerol Negative    2. Control-Histamine 3+    3. Bahia Negative    4. French Southern Territories Negative    5. Johnson Negative    6. Kentucky Blue Negative    7. Meadow Fescue Negative    8. Perennial Rye Negative    9. Timothy Negative    10. Ragweed Mix Negative    11. Cocklebur Negative    12. Plantain,  English Negative    13. Baccharis Negative    14. Dog Fennel Negative    15. Russian Thistle Negative    16. Lamb's Quarters Negative    17. Sheep Sorrell Negative    18. Rough Pigweed Negative    19. Marsh Elder, Rough Negative    20. Mugwort, Common Negative    21. Box, Elder Negative    22.  Cedar, red Negative    23. Sweet Gum Negative    24. Pecan Pollen Negative    25. Pine Mix Negative    26. Walnut, Black Pollen Negative    27. Red Mulberry Negative    28. Ash Mix Negative    29. Birch Mix Negative    30. Beech American Negative    31. Cottonwood, Guinea-Bissau Negative    32. Hickory, White Negative    33. Maple Mix Negative    34. Oak, [place] Mix Negative    35. Sycamore Eastern Negative    36. Alternaria Alternata Negative    37. Cladosporium Herbarum Negative    38. Aspergillus Mix Negative    39. Penicillium Mix Negative    40. Bipolaris Sorokiniana (Helminthosporium) Negative    41. Drechslera Spicifera (Curvularia) Negative    42.  Mucor Plumbeus Negative    43. Fusarium Moniliforme Negative    44. Aureobasidium Pullulans (pullulara) Negative    45. Rhizopus Oryzae Negative    46. Botrytis Cinera Negative    47. Epicoccum Nigrum Negative    48. Phoma Betae Negative    49. Dust Mite Mix Negative    50. Cat Hair 10,000 BAU/ml Negative    51.  Dog Epithelia Negative    52. Mixed Feathers Negative    53. Horse Epithelia Negative    54. Cockroach, German Negative    55. Tobacco Leaf Negative             13 Food Perc - 12/27/23 0858       Test Information   Time Antigen Placed 4098    Allergen Manufacturer Waynette Buttery    Location Back    Number of allergen test 13    Food Select      Food   1. Peanut Negative    2. Soybean Negative    3. Wheat Negative    4. Sesame Negative    5. Milk, Cow Negative    6. Casein Negative    7. Egg White, Chicken Negative    8. Shellfish Mix Negative    9. Fish Mix Negative    10. Cashew Negative    11. Walnut Food Negative    12. Almond Negative    13. Hazelnut Negative

## 2023-12-27 NOTE — Progress Notes (Signed)
FOLLOW UP  Date of Service/Encounter:  12/27/23   Assessment:   Chronic urticaria - with reassuring labs (starting Xolair)  Elevated ANA (1:80 titer) - deferring on Rheumatology referral for now   Chronic non-allergic rhinitis   Right sided nasal polyp  Plan/Recommendations:   1. Chronic urticaria  - Most of your labs looked good, but you did have an elevated ANA, which is marker of autoimmune disease. - The rest of the labs were normal.  - We could send you to Rheumatology if you are interested, but I am not convinced that they would be excited about it. - Xolair initiated today (300mg  every 28 days). - Testing today was negative to the entire environmental panel and the most common foods.  - We talked about this last time AND it can help with nasal polyps as well. - In the meantime, continue with suppressive dosing of antihistamines:   - Morning: Xyzal (levocetirizine) 10mg  (two tablets) + Pepcid (famotidine) 20mg   - Evening: Zyrtec (cetirizine) 20mg  (two tablets) + Pepcid (famotidine) 20mg  - You can change this dosing at home, decreasing the dose as needed or increasing the dosing as needed.  - If you are not tolerating the medications or are tired of taking them every day, we can start treatment with a monthly injectable medication called Xolair.  - Information on Xolair: [email or web address]-[email or web address]  2. Chronic rhinitis - Testing today showed: NEGATIVE TO THE ENTIRE PANEL - Copy of test results provided.  - Continue with: antihistamines as above  3. Return in about 3 months (around 03/25/2024). You can have the follow up appointment with Dr. Dellis Anes or a Nurse Practicioner (our Nurse Practitioners are excellent and always have Physician oversight!).     Subjective:   Jeffrey Francis is a 48 y.o. male presenting today for follow up of  Chief Complaint  Patient presents with   Allergy Testing    1-55, 1-13    Jeffrey Francis has a history of the following: Patient  Active Problem List   Diagnosis Date Noted   Recurrent ventral hernia     History obtained from: chart review and patient.  Discussed the use of AI scribe software for clinical note transcription with the patient and/or guardian, who gave verbal consent to proceed.  Jeffrey Francis is a 48 y.o. male presenting for skin testing. He was last seen on February 6th. We could not do testing because his insurance company does not cover testing on the same day as a New Patient visit. He has been off of all antihistamines 3 days in anticipation of the testing.   Otherwise, there have been no changes to his past medical history, surgical history, family history, or social history.    Review of systems otherwise negative other than that mentioned in the HPI.    Objective:   There were no vitals taken for this visit. There is no height or weight on file to calculate BMI.    Physical exam deferred since this was a skin testing appointment only.   Diagnostic studies:   Allergy Studies:     Airborne Adult Perc - 12/27/23 0857     Time Antigen Placed 0857    Allergen Manufacturer Waynette Buttery    Location Back    Number of Test 55    Panel 1 Select    1. Control-Buffer 50% Glycerol Negative    2. Control-Histamine 3+    3. Bahia Negative    4. [place] Territ[place] Negative    5. Johnson Negative  6. [facility] Negative    7. Meadow Fescue Negative    8. Perennial Rye Negative    9. Timothy Negative    10. Ragweed Mix Negative    11. Cocklebur Negative    12. Plantain,  English Negative    13. Baccharis Negative    14. Dog Fennel Negative    15. Russian Thistle Negative    16. Lamb's Quarters Negative    17. Sheep Sorrell Negative    18. Rough Pigweed Negative    19. Marsh Elder, Rough Negative    20. Mugwort, Common Negative    21. Box, Elder Negative    22. Cedar, red Negative    23. Sweet Gum Negative    24. Pecan Pollen Negative    25. Pine Mix Negative    26. Walnut, Black Pollen Negative     27. Red Mulberry Negative    28. Ash Mix Negative    29. Birch Mix Negative    30. Beech American Negative    31. Cottonwood, Guinea-Bissau Negative    32. Hickory, White Negative    33. Maple Mix Negative    34. Oak, [place] Mix Negative    35. Sycamore Eastern Negative    36. Alternaria Alternata Negative    37. Cladosporium Herbarum Negative    38. Aspergillus Mix Negative    39. Penicillium Mix Negative    40. Bipolaris Sorokiniana (Helminthosporium) Negative    41. Drechslera Spicifera (Curvularia) Negative    42. Mucor Plumbeus Negative    43. Fusarium Moniliforme Negative    44. Aureobasidium Pullulans (pullulara) Negative    45. Rhizopus Oryzae Negative    46. Botrytis Cinera Negative    47. Epicoccum Nigrum Negative    48. Phoma Betae Negative    49. Dust Mite Mix Negative    50. Cat Hair 10,000 BAU/ml Negative    51.  Dog Epithelia Negative    52. Mixed Feathers Negative    53. Horse Epithelia Negative    54. Cockroach, German Negative    55. Tobacco Leaf Negative             13 Food Perc - 12/27/23 0858       Test Information   Time Antigen Placed 5643    Allergen Manufacturer Waynette Buttery    Location Back    Number of allergen test 13    Food Select      Food   1. Peanut Negative    2. Soybean Negative    3. Wheat Negative    4. Sesame Negative    5. Milk, Cow Negative    6. Casein Negative    7. Egg White, Chicken Negative    8. Shellfish Mix Negative    9. Fish Mix Negative    10. Cashew Negative    11. Walnut Food Negative    12. Almond Negative    13. Hazelnut Negative             Allergy testing results were read and interpreted by myself, documented by clinical staff.      Jeffrey Bonds, MD  [facility]

## 2023-12-27 NOTE — Telephone Encounter (Signed)
Patient started Xolair today and received 300mg  Sample in office. Per Dr. Dellis Anes patient to receive 300mg  every 28 days for Chronic Urticaria. Advised patient that you will reach out in regards to next dose of medication after processing with insurance.

## 2023-12-27 NOTE — Progress Notes (Signed)
Immunotherapy   Patient Details  Name: Jeffrey Francis MRN: [id number] Date of Birth: June 22, 1976  12/27/2023  Jeffrey Francis started injections for  Xolair  Frequency: Every 28 Days Epi-Pen: Yes Consent signed and patient instructions given. Patient started Xolair today and received 300mg  Sample injection in the [facility]. Patient waited one hour in office and did not experience any issues.   [name] 12/27/2023, 9:43 AM

## 2023-12-28 LAB — CMP14+EGFR
ALT: 30 [IU]/L (ref 0–44)
AST: 19 [IU]/L (ref 0–40)
Albumin: 4.5 g/dL (ref 4.1–5.1)
Alkaline Phosphatase: 79 [IU]/L (ref 44–121)
BUN/Creatinine Ratio: 15 (ref 9–20)
BUN: 12 mg/dL (ref 6–24)
Bilirubin Total: 0.5 mg/dL (ref 0.0–1.2)
CO2: 25 mmol/L (ref 20–29)
Calcium: 9.8 mg/dL (ref 8.7–10.2)
Chloride: 99 mmol/L (ref 96–106)
Creatinine, Ser: 0.8 mg/dL (ref 0.76–1.27)
Globulin, Total: 2.5 g/dL (ref 1.5–4.5)
Glucose: 98 mg/dL (ref 70–99)
Potassium: 3.9 mmol/L (ref 3.5–5.2)
Sodium: 142 mmol/L (ref 134–144)
Total Protein: 7 g/dL (ref 6.0–8.5)
eGFR: 111 mL/min/{1.73_m2} (ref >59)

## 2023-12-28 LAB — SEDIMENTATION RATE: Sed Rate: 8 mm/h (ref 0–15)

## 2023-12-28 LAB — TRYPTASE: Tryptase: 6.4 ug/L (ref 2.2–13.2)

## 2023-12-28 LAB — CHRONIC URTICARIA: cu index: 44.9 — ABNORMAL HIGH (ref <10)

## 2023-12-28 LAB — THYROID ANTIBODIES
Thyroglobulin Antibody: <1 [IU]/mL (ref 0.0–0.9)
Thyroperoxidase Ab SerPl-aCnc: 9 [IU]/mL (ref 0–34)

## 2023-12-28 LAB — ANTINUCLEAR ANTIBODIES, IFA

## 2023-12-28 LAB — C-REACTIVE PROTEIN: CRP: 6 mg/L (ref 0–10)

## 2023-12-28 LAB — FANA STAINING PATTERNS: Speckled Pattern: 1:80 {titer}

## 2024-01-10 NOTE — Telephone Encounter (Signed)
 Called patient and advised approval, copay card and submit for XOlair to [facility]. Willr each out for next dose once delivery set

## 2024-01-10 NOTE — Telephone Encounter (Signed)
Thanks, Tammy!   Akeel Reffner, MD [facility]  

## 2024-01-22 NOTE — Telephone Encounter (Signed)
 Called patient and advised delivery 3/13 and scheduled for 2nd dose

## 2024-01-22 NOTE — Telephone Encounter (Signed)
 Patient called and stated that he is currently broken out in hives. He is taking 2 xyzal in the morning with pepcid and 2 zyrtec in the evening with pepcid in the evening. Patient woke up this morning with aching hands and hives, is there anything you recommend? He was also wanting to follow up on the status of his xolair injections, he did receive a call from insurance about approval and confirmed shipment address.

## 2024-01-23 NOTE — Telephone Encounter (Signed)
Awesome - thank you, Jeffrey Francis!   Salvatore Marvel, MD [facility]

## 2024-01-24 ENCOUNTER — Ambulatory Visit

## 2024-01-24 DIAGNOSIS — L501 Idiopathic urticaria: Secondary | ICD-10-CM

## 2024-01-24 MED ORDER — OMALIZUMAB 150 MG/ML ~~LOC~~ SOSY
300.0000 mg | PREFILLED_SYRINGE | SUBCUTANEOUS | Status: AC
  Administered 2024-01-24 – 2024-08-14 (×12): 300 mg via SUBCUTANEOUS

## 2024-01-28 ENCOUNTER — Telehealth: Payer: Self-pay | Admitting: *Deleted

## 2024-01-28 ENCOUNTER — Other Ambulatory Visit: Payer: Self-pay | Admitting: *Deleted

## 2024-01-28 MED ORDER — EPINEPHRINE 0.3 MG/0.3ML IJ SOAJ: 0.3000 mg | INTRAMUSCULAR | 1 refills | Status: AC | PRN

## 2024-01-28 NOTE — Telephone Encounter (Signed)
 Patient called and stated that he is still having hives even after his second dose of xolair and taking the supressing antihistamines 2 times daily as advised. He said that his hives have become worse since receiving the second dose of xolair. I advised that you are out of office today but will respond tomorrow. Patient verbalized understanding.

## 2024-01-29 MED ORDER — PREDNISONE 10 MG PO TABS: ORAL_TABLET | ORAL | 0 refills | Status: DC

## 2024-01-29 NOTE — Addendum Note (Signed)
 Addended by: Alfonse Spruce on: 01/29/2024 04:50 PM   Modules accepted: Orders

## 2024-01-29 NOTE — Telephone Encounter (Signed)
 Spoke with patient, informed him of Dr. Ellouise Newer recommendation. Patient verbalized understanding and will give [place] a call in a week with an update.

## 2024-01-29 NOTE — Telephone Encounter (Signed)
 Goodness - that is odd. Let's get him on a course of prednisone to get this calmed down. Have him give [place] an update in a week. Stay on the same doses of antihistamines.   [name], MD [facility]

## 2024-02-14 ENCOUNTER — Ambulatory Visit (INDEPENDENT_AMBULATORY_CARE_PROVIDER_SITE_OTHER): Admitting: Allergy & Immunology

## 2024-02-14 ENCOUNTER — Encounter: Payer: Self-pay | Admitting: Allergy & Immunology

## 2024-02-14 ENCOUNTER — Other Ambulatory Visit: Payer: Self-pay

## 2024-02-14 VITALS — BP 136/82 | HR 106 | Temp 99.1°F | Wt 249.0 lb

## 2024-02-14 DIAGNOSIS — L501 Idiopathic urticaria: Secondary | ICD-10-CM | POA: Diagnosis not present

## 2024-02-14 DIAGNOSIS — J310 Chronic rhinitis: Secondary | ICD-10-CM

## 2024-02-14 MED ORDER — PREDNISONE 10 MG PO TABS: ORAL_TABLET | ORAL | 0 refills | Status: DC

## 2024-02-14 NOTE — Progress Notes (Signed)
 FOLLOW UP  Date of Service/Encounter:  02/14/24   Assessment:   Chronic urticaria - with reassuring labs (on Xolair s/p two doses)   Elevated ANA (1:80 titer) - consider Rheumatology referral if no improvement at the next visit   Chronic non-allergic rhinitis   Right sided nasal polyp    Plan/Recommendations:   1. Chronic urticaria  - Usually we recommend waiting three doses before calling Xolair a failure.  - We are going to add on Allegra two tablets in the middle of the day (this causes the least amount of sleepiness). - Prednisone script provided in case you need to start this when you are on vacation.  - GIVE [place] an update in a couple of weeks. - In the meantime, continue with suppressive dosing of antihistamines:   - Morning: Xyzal (levocetirizine) 10mg  (two tablets) + Pepcid (famotidine) 20mg   - NOON: Allegra (fexofenadine) 360mg  (two tablets)  - Evening: Zyrtec (cetirizine) 20mg  (two tablets) + Pepcid (famotidine) 20mg   - You can change this dosing at home, decreasing the dose as needed or increasing the dosing as needed.   2. Chronic rhinitis - Testing in the past showed: NEGATIVE TO THE ENTIRE PANEL - Copy of test results provided.  - Continue with: antihistamines as above  3. Follow up as scheduled.   Subjective:   Jeffrey Francis is a 48 y.o. male presenting today for follow up of  Chief Complaint  Patient presents with   Chronic Urticaria   Alleregic Rhinitis   Follow-up    Jeffrey Francis has a history of the following: Patient Active Problem List   Diagnosis Date Noted   Recurrent ventral hernia     History obtained from: chart review and patient.  Discussed the use of AI scribe software for clinical note transcription with the patient and/or guardian, who gave verbal consent to proceed.  Jeffrey Francis is a 48 y.o. male presenting for a sick visit.  He was last seen in February 2025.  At that time, we reviewed his labs which showed a mildly elevated ANA  with a titer of 1:80.  We did decide to hold off on referring him to rheumatology.  We started him on Xolair 30 mg every 28 days.  We continued Xyzal and Pepcid twice daily.  Had environmental testing that was negative to the entire panel.  In the interim, he is continued to have hive outbreak despite 2 doses of Xolair.  He started injections for Xolair in February 2025.  He received a second injection on March 13th.  Since last time we talked to him, he has experienced a worsening of his chronic hives, which have spread to new areas such as under his arms. The hives are itchy and sometimes painful, particularly in the mornings when his skin feels like it is 'on fire'. His hands swell and ache, making it difficult to work with his hands. The symptoms tend to ease off by midday. No significant issues with environmental allergies, although he did wake up 'a little sneezy' one morning. Hives are worse in the mornings, with bilateral hand swelling and pain. No significant itching at night, but his armpits have been particularly itchy recently.  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  Therefore, he is a little self-conscious about all the hives over his arms.  He is currently receiving Xolair injections, with the first half-dose providing relief for a month (this was administered in February 2025). However, subsequent full doses  have not been effective. He is also taking Xyzal and Pepcid in the morning and cetirizine and Pepcid at night, but these have not alleviated his symptoms. He has previously tried montelukast without noticeable benefit. Steroids are the only treatment that has provided relief, but he is concerned about the long-term effects and does not wish to continue them.  Hives completely resolved when he is on prednisone.  They do not leave any permanent skin changes.  Even now, the hives come and go with the hives that first started on his upper arms now completely  resolved.  He is planning a trip to [place] and is concerned about managing his symptoms while away. He is scheduled for his next Xolair injection on April 10th, and then he will be leaving town shortly thereafter.  We have a follow-up scheduled with him on May 15.  Otherwise, there have been no changes to his past medical history, surgical history, family history, or social history.    Review of systems otherwise negative other than that mentioned in the HPI.    Objective:   Blood pressure 136/82, pulse (!) 106, temperature 99.1 F (37.3 C), temperature source Temporal, weight 249 lb (112.9 kg), SpO2 98%. Body mass index is 52.95 kg/m.    Physical Exam Constitutional:      Appearance: He is well-developed.  HENT:     Head: Normocephalic and atraumatic.     Right Ear: Tympanic membrane, ear canal and external ear normal. No drainage, swelling or tenderness. Tympanic membrane is not injected, scarred, erythematous, retracted or bulging.     Left Ear: Tympanic membrane, ear canal and external ear normal. No drainage, swelling or tenderness. Tympanic membrane is not injected, scarred, erythematous, retracted or bulging.     Nose: No nasal deformity, septal deviation, mucosal edema or rhinorrhea.     Right Turbinates: Enlarged, swollen and pale.     Left Turbinates: Enlarged, swollen and pale.     Right Sinus: No maxillary sinus tenderness or frontal sinus tenderness.     Left Sinus: No maxillary sinus tenderness or frontal sinus tenderness.     Comments: Right sided nasal polyp present obstructing around 75% of the nasal cavity.    Mouth/Throat:     Mouth: Mucous membranes are not pale and not dry.     Pharynx: Uvula midline.  Eyes:     General:        Right eye: No discharge.        Left eye: No discharge.     Conjunctiva/sclera: Conjunctivae normal.     Right eye: Right conjunctiva is not injected. No chemosis.    Left eye: Left conjunctiva is not injected. No chemosis.     Pupils: Pupils are equal, round, and reactive to light.  Cardiovascular:     Rate and Rhythm: Normal rate and regular rhythm.     Heart sounds: Normal heart sounds.  Pulmonary:     Effort: Pulmonary effort is normal. No tachypnea, accessory muscle usage or respiratory distress.     Breath sounds: Normal breath sounds. No wheezing, rhonchi or rales.  Chest:     Chest wall: No tenderness.  Abdominal:     Tenderness: There is no abdominal tenderness. There is no guarding or rebound.  Lymphadenopathy:     Head:     Right side of head: No submandibular, tonsillar or occipital adenopathy.     Left side of head: No submandibular, tonsillar or occipital adenopathy.     Cervical: No cervical  adenopathy.  Skin:    General: Skin is warm.     Capillary Refill: Capillary refill takes less than 2 seconds.     Coloration: Skin is not pale.     Findings: Rash present. No abrasion, erythema or petechiae. Rash is urticarial. Rash is not papular or vesicular.     Comments: Hives over the bilateral arms extending onto the wrist and hands.  There is some swelling of the bilateral hands.  Neurological:     Mental Status: He is alert.      Diagnostic studies: none      [name], MD  [facility]

## 2024-02-14 NOTE — Patient Instructions (Addendum)
 1. Chronic urticaria  - Usually we recommend waiting three doses before calling Xolair a failure.  - We are going to add on Allegra two tablets in the middle of the day (this causes the least amount of sleepiness). - Prednisone script provided in case you need to start this when you are on vacation.  - GIVE [place] an update in a couple of weeks. - In the meantime, continue with suppressive dosing of antihistamines:   - Morning: Xyzal (levocetirizine) 10mg  (two tablets) + Pepcid (famotidine) 20mg   - NOON: Allegra (fexofenadine) 360mg  (two tablets)  - Evening: Zyrtec (cetirizine) 20mg  (two tablets) + Pepcid (famotidine) 20mg   - You can change this dosing at home, decreasing the dose as needed or increasing the dosing as needed.   2. Chronic rhinitis - Testing in the past showed: NEGATIVE TO THE ENTIRE PANEL - Copy of test results provided.  - Continue with: antihistamines as above  3. Follow up as scheduled.    Please inform us of any Emergency Department visits, hospitalizations, or changes in symptoms. Call us before going to the ED for breathing or allergy symptoms since we might be able to fit you in for a sick visit. Feel free to contact us anytime with any questions, problems, or concerns.  It was a pleasure to see you again today!  Websites that have reliable patient information: 1. American Academy of Asthma, Allergy, and Immunology: www.aaaai.org 2. Food Allergy Research and Education (FARE): foodallergy.org 3. Mothers of Asthmatics: http://www.asthmacommunitynetwork.org 4. American College of Allergy, Asthma, and Immunology: www.acaai.org      "Like" [place] on Facebook and Instagram for our latest updates!      A healthy democracy works best when [employer] participate! Make sure you are registered to vote! If you have moved or changed any of your contact information, you will need to get this updated before voting! Scan the QR codes below to learn more!

## 2024-02-21 ENCOUNTER — Ambulatory Visit

## 2024-02-21 DIAGNOSIS — L501 Idiopathic urticaria: Secondary | ICD-10-CM

## 2024-03-27 ENCOUNTER — Ambulatory Visit (INDEPENDENT_AMBULATORY_CARE_PROVIDER_SITE_OTHER): Payer: BC Managed Care – PPO | Admitting: Allergy & Immunology

## 2024-03-27 ENCOUNTER — Other Ambulatory Visit: Payer: Self-pay | Admitting: Allergy & Immunology

## 2024-03-27 ENCOUNTER — Other Ambulatory Visit: Payer: Self-pay

## 2024-03-27 ENCOUNTER — Encounter: Payer: Self-pay | Admitting: Allergy & Immunology

## 2024-03-27 ENCOUNTER — Ambulatory Visit

## 2024-03-27 VITALS — BP 130/70 | HR 119 | Temp 98.6°F | Resp 10 | Ht 66.73 in | Wt 250.8 lb

## 2024-03-27 DIAGNOSIS — L501 Idiopathic urticaria: Secondary | ICD-10-CM

## 2024-03-27 DIAGNOSIS — J310 Chronic rhinitis: Secondary | ICD-10-CM

## 2024-03-27 MED ORDER — LEVOCETIRIZINE DIHYDROCHLORIDE 5 MG PO TABS: ORAL_TABLET | ORAL | 2 refills | Status: DC

## 2024-03-27 MED ORDER — PREDNISONE 10 MG PO TABS
ORAL_TABLET | ORAL | 0 refills | Status: DC
Start: 2024-03-27 — End: 2024-04-18

## 2024-03-27 MED ORDER — LEVOCETIRIZINE DIHYDROCHLORIDE 5 MG PO TABS: ORAL_TABLET | ORAL | 5 refills | Status: DC

## 2024-03-27 NOTE — Patient Instructions (Addendum)
 1. Chronic urticaria  - Information on Dupixent provided. - Dupixent did NOT work very well on those patients who are having breakthroughs with Xolair , so I am not sure that this is going to be helpful. - I think we should increase the Xolair  to every TWO WEEKS and you can get them in [place]. - This will stay on top of the hives and hopefully should improve the control.  - Prednisone  script provided in case you need to start this. - In the meantime, continue with suppressive dosing of antihistamines:   - Morning: Xyzal  (levocetirizine) 10mg  (two tablets) + Pepcid  (famotidine ) 20mg   - NOON: Allegra (fexofenadine) 360mg  (two tablets)  - Evening: Zyrtec  (cetirizine ) 20mg  (two tablets) + Pepcid  (famotidine ) 20mg   - You can change this dosing at home, decreasing the dose as needed or increasing the dosing as needed.   2. Chronic rhinitis - Testing in the past showed: NEGATIVE TO THE ENTIRE PANEL - Copy of test results provided.  - Continue with: antihistamines as above  3. Follow up in 2 months or earlier if needed.    Please inform us  of any Emergency Department visits, hospitalizations, or changes in symptoms. Call us  before going to the ED for breathing or allergy  symptoms since we might be able to fit you in for a sick visit. Feel free to contact us  anytime with any questions, problems, or concerns.  It was a pleasure to see you again today!  Websites that have reliable patient information: 1. American Academy of Asthma, Allergy , and Immunology: www.aaaai.org 2. Food Allergy  Research and Education (FARE): foodallergy.org 3. Mothers of Asthmatics: http://www.asthmacommunitynetwork.org 4. American College of Allergy , Asthma, and Immunology: www.acaai.org      "Like" us  on Facebook and Instagram for our latest updates!      A healthy democracy works best when [employer] participate! Make sure you are registered to vote! If you have moved or changed any of your contact information,  you will need to get this updated before voting! Scan the QR codes below to learn more!

## 2024-03-27 NOTE — Progress Notes (Signed)
 FOLLOW UP  Date of Service/Encounter:  03/27/24   Assessment:   Autoimmune urticaria - with reassuring labs (on Xolair  s/p two doses)   Elevated ANA (1:80 titer) - consider Rheumatology referral if no improvement at the next visit  Failed Singulair and Allegra    Chronic non-allergic rhinitis   Right sided nasal polyp  Plan/Recommendations:   1. Chronic urticaria  - Information on Dupixent provided. - Dupixent did NOT work very well on those patients who are having breakthroughs with Xolair , so I am not sure that this is going to be helpful. - I think we should increase the Xolair  to every TWO WEEKS and you can get them in [place]. - This will stay on top of the hives and hopefully should improve the control.  - Prednisone  script provided in case you need to start this. - In the meantime, continue with suppressive dosing of antihistamines:   - Morning: Xyzal  (levocetirizine) 10mg  (two tablets) + Pepcid  (famotidine ) 20mg   - NOON: Allegra (fexofenadine) 360mg  (two tablets)  - Evening: Zyrtec  (cetirizine ) 20mg  (two tablets) + Pepcid  (famotidine ) 20mg   - You can change this dosing at home, decreasing the dose as needed or increasing the dosing as needed.   2. Chronic rhinitis - Testing in the past showed: NEGATIVE TO THE ENTIRE PANEL - Copy of test results provided.  - Continue with: antihistamines as above  3. Follow up in 2 months or earlier if needed.   Subjective:   AB Jeffrey Francis is a 48 y.o. male presenting today for follow up of  Chief Complaint  Patient presents with   Follow-up   Urticaria    Uriticaria still experiencing hives everywhere.     [name] has a history of the following: Patient Active Problem List   Diagnosis Date Noted   Recurrent ventral hernia     History obtained from: chart review and patient.  Discussed the use of AI scribe software for clinical note transcription with the patient and/or guardian, who gave verbal consent to  proceed.  Jeffrey Francis is a 48 y.o. male presenting for a follow up visit.  He was last seen in April 2025.  At that time, we added on Allegra 2 tablets in the middle of the day to see if this would help with his breakthrough urticaria.  We continue with Xolair  as well as Xyzal  and famotidine  in the morning and Zyrtec  and famotidine  at night.  For his nonallergic rhinitis, he was under good control with all of the antihistamines.  Since the last visit, he has not done well.  He always seems to flare a week or 2 before his Xolair  is due.  The Xolair  really has not worked as well since he first got it. He for started the Xolair  in April 2025.  He experiences chronic hives and itching, which worsen as the effects of his Xolair  injections wear off. Relief is noted for a couple of weeks post-injection, but symptoms are not completely resolved. The hives appear on his arms, hands, torso, legs, and forehead, with associated swelling of his feet, hands, eyes, and lips. The hives are darker in the morning and improve somewhat throughout the day.  He has attempted various lifestyle changes, including dietary modifications, avoiding sweets, changing soaps, and removing a couch, but these have not led to consistent improvement. Certain foods, like a drink from [employer], sometimes trigger hives immediately, but this is not consistent.  He uses prednisone , which provides relief within a couple of  hours, but he is cautious about its use due to concerns about its effects on his heart. He has a history of elevated heart rate. Other medications such as Allegra and Singulair have not been effective, and he uses over-the-counter Xyzal  as a substitute for his prescription.  He lives in [place] and works in [place], requiring a significant commute to his appointments. His wife, who is diabetic, is capable of administering injections at home if necessary. No anaphylaxis, such as throat swelling, has been experienced during his  episodes of hives, and his skin returns to normal when the hives clear up.     Otherwise, there have been no changes to his past medical history, surgical history, family history, or social history.    Review of systems otherwise negative other than that mentioned in the HPI.    Objective:   Blood pressure 130/70, pulse (!) 119, temperature 98.6 F (37 C), temperature source Temporal, resp. rate 10, height 5' 6.73" (1.695 m), weight 250 lb 12.8 oz (113.8 kg), SpO2 96%. Body mass index is 39.6 kg/m.    Physical Exam Constitutional:      Appearance: He is well-developed.  HENT:     Head: Normocephalic and atraumatic.     Right Ear: Tympanic membrane, ear canal and external ear normal. No drainage, swelling or tenderness. Tympanic membrane is not injected, scarred, erythematous, retracted or bulging.     Left Ear: Tympanic membrane, ear canal and external ear normal. No drainage, swelling or tenderness. Tympanic membrane is not injected, scarred, erythematous, retracted or bulging.     Nose: No nasal deformity, septal deviation, mucosal edema or rhinorrhea.     Right Turbinates: Enlarged, swollen and pale.     Left Turbinates: Enlarged, swollen and pale.     Right Sinus: No maxillary sinus tenderness or frontal sinus tenderness.     Left Sinus: No maxillary sinus tenderness or frontal sinus tenderness.     Comments: Right sided nasal polyp present obstructing around 75% of the nasal cavity.    Mouth/Throat:     Mouth: Mucous membranes are not pale and not dry.     Pharynx: Uvula midline.  Eyes:     General:        Right eye: No discharge.        Left eye: No discharge.     Conjunctiva/sclera: Conjunctivae normal.     Right eye: Right conjunctiva is not injected. No chemosis.    Left eye: Left conjunctiva is not injected. No chemosis.    Pupils: Pupils are equal, round, and reactive to light.  Cardiovascular:     Rate and Rhythm: Normal rate and regular rhythm.     Heart  sounds: Normal heart sounds.  Pulmonary:     Effort: Pulmonary effort is normal. No tachypnea, accessory muscle usage or respiratory distress.     Breath sounds: Normal breath sounds. No wheezing, rhonchi or rales.  Chest:     Chest wall: No tenderness.  Abdominal:     Tenderness: There is no abdominal tenderness. There is no guarding or rebound.  Lymphadenopathy:     Head:     Right side of head: No submandibular, tonsillar or occipital adenopathy.     Left side of head: No submandibular, tonsillar or occipital adenopathy.     Cervical: No cervical adenopathy.  Skin:    General: Skin is warm.     Capillary Refill: Capillary refill takes less than 2 seconds.     Coloration: Skin is not  pale.     Findings: Rash present. No abrasion, erythema or petechiae. Rash is urticarial. Rash is not papular or vesicular.     Comments: Hives over the bilateral arms extending onto the wrist and hands.  There is some swelling of the bilateral hands.  Excoriations present.  Neurological:     Mental Status: He is alert.      Diagnostic studies: none      Drexel Gentles, MD  [facility] 

## 2024-04-01 ENCOUNTER — Encounter: Payer: Self-pay | Admitting: Allergy & Immunology

## 2024-04-01 ENCOUNTER — Telehealth: Payer: Self-pay | Admitting: *Deleted

## 2024-04-01 NOTE — Telephone Encounter (Signed)
 Okay.  Sounds good thank you!

## 2024-04-01 NOTE — Telephone Encounter (Signed)
 T/c to patient Ins would not approve 450 mg dosing but did improve 300mg  every 14 days and new rx sent to [facility] will start dosing with next delivery

## 2024-04-01 NOTE — Telephone Encounter (Signed)
-----   Message from Jeffrey Francis sent at 03/27/2024  3:21 PM EDT ----- Can we try 450mg  every two weeks for hives ?

## 2024-04-10 ENCOUNTER — Other Ambulatory Visit: Payer: Self-pay

## 2024-04-10 ENCOUNTER — Encounter: Payer: Self-pay | Admitting: Allergy & Immunology

## 2024-04-10 ENCOUNTER — Ambulatory Visit: Admitting: Allergy & Immunology

## 2024-04-10 VITALS — BP 140/80 | HR 112 | Temp 98.4°F | Resp 18 | Ht 66.93 in | Wt 248.6 lb

## 2024-04-10 DIAGNOSIS — J310 Chronic rhinitis: Secondary | ICD-10-CM

## 2024-04-10 DIAGNOSIS — L501 Idiopathic urticaria: Secondary | ICD-10-CM | POA: Diagnosis not present

## 2024-04-10 NOTE — Patient Instructions (Addendum)
 1. Chronic urticaria  - We are going to start cyclosporine today to see if that can work WITH the Xolair . - This can affect the kidney and cause increased blood pressure, so monitor that at home. - While there are side effects, it is certainly better than staying on prednisone , which has a TON of side effects.  - We will check the blood pressures on your Xolair  injection visits. - Read about Btk inhibitors and hives (these are hitting the market in the fall). - Continue with Xolair  every 2 weeks. - In the meantime, continue with suppressive dosing of antihistamines:   - Morning: Xyzal  (levocetirizine) 10mg  (two tablets) + Pepcid  (famotidine ) 20mg  + cyclosporine 150mg    - Evening: Zyrtec  (cetirizine ) 20mg  (two tablets) + Pepcid  (famotidine ) 20mg  + cyclosporine 150mg   - You can change this dosing at home, decreasing the dose as needed or increasing the dosing as needed.   2. Chronic rhinitis - Testing in the past showed: NEGATIVE TO THE ENTIRE PANEL - Copy of test results provided.  - Continue with: antihistamines as above  3. Follow up in 2 months or earlier if needed.    Please inform us  of any Emergency Department visits, hospitalizations, or changes in symptoms. Call us  before going to the ED for breathing or allergy  symptoms since we might be able to fit you in for a sick visit. Feel free to contact us  anytime with any questions, problems, or concerns.  It was a pleasure to see you again today!  Websites that have reliable patient information: 1. American Academy of Asthma, Allergy , and Immunology: www.aaaai.org 2. Food Allergy  Research and Education (FARE): foodallergy.org 3. Mothers of Asthmatics: http://www.asthmacommunitynetwork.org 4. [employer] of Allergy , Asthma, and Immunology: www.acaai.org      "Like" us  on Facebook and Instagram for our latest updates!      A healthy democracy works best when [employer] participate! Make sure you are registered to vote! If  you have moved or changed any of your contact information, you will need to get this updated before voting! Scan the QR codes below to learn more!

## 2024-04-10 NOTE — Progress Notes (Signed)
 FOLLOW UP  Date of Service/Encounter:  04/10/24   Assessment:   Autoimmune urticaria - with reassuring labs (on Xolair  now at every 14 days)  Considering cyclosporine - getting labs today    Elevated ANA (1:80 titer) - consider Rheumatology referral if no improvement at the next visit   Failed Singulair and Allegra    Chronic non-allergic rhinitis   Right sided nasal polyp  Plan/Recommendations:    1. Chronic urticaria  - We are going to start cyclosporine today to see if that can work WITH the Xolair . - This can affect the kidney and cause increased blood pressure, so monitor that at home. - While there are side effects, it is certainly better than staying on prednisone , which has a TON of side effects.  - We will check the blood pressures on your Xolair  injection visits. - Read about Btk inhibitors and hives (these are hitting the market in the fall). - Continue with Xolair  every 2 weeks. - In the meantime, continue with suppressive dosing of antihistamines:   - Morning: Xyzal  (levocetirizine) 10mg  (two tablets) + Pepcid  (famotidine ) 20mg  + cyclosporine 150mg    - Evening: Zyrtec  (cetirizine ) 20mg  (two tablets) + Pepcid  (famotidine ) 20mg  + cyclosporine 150mg   - You can change this dosing at home, decreasing the dose as needed or increasing the dosing as needed.   2. Chronic rhinitis - Testing in the past showed: NEGATIVE TO THE ENTIRE PANEL - Copy of test results provided.  - Continue with: antihistamines as above  3. Follow up in 2 months or earlier if needed.    Subjective:   Jeffrey Francis is a 48 y.o. male presenting today for follow up of  Chief Complaint  Patient presents with   Establish Care   Urticaria    ADE STMARIE has a history of the following: Patient Active Problem List   Diagnosis Date Noted   Recurrent ventral hernia     History obtained from: chart review and patient.  Discussed the use of AI scribe software for clinical note  transcription with the patient and/or guardian, who gave verbal consent to proceed.  Jeffrey Francis is a 48 y.o. male presenting for a follow up visit.  He was last seen in May 2025.  At that time, we will give him information on Dupixent.  However, I did not feel like transitioning to Dupixent would do much since the Xolair  failures did not really do well on the studies with Dupixent and hives.  We recommended increasing the Xolair  to every 2 weeks.  He was going to get these in [place].  We gave him a prednisone  prescription to have on hand in case he had a breakout.  We continue with Xyzal  10 mg and Pepcid  20 mg in the morning, Allegra 2 tablets at noon, Zyrtec  20 mg and Pepcid  20 mg at night, with breakthrough antihistamines as needed.  He had testing to environmental panels that were negative to the entire panel.  He has been experiencing persistent skin symptoms despite treatment with Xolair . Initially, he received Xolair  injections once a month, but due to lack of efficacy, the frequency was increased to every two weeks. However, he has not yet started the bi-weekly regimen as he was scheduled to begin today. The approved dosage is 300 mg every two weeks.  He describes spontaneous breakouts characterized by redness, welts, and lumps primarily on his arms, neck, legs, and occasionally his stomach and chest. He also experiences facial swelling, tightness, and edema, particularly  in the morning, with swollen lips and bags under his eyes. These symptoms are described as 'miserable', causing significant discomfort and impacting his sleep due to itching and burning sensations.  He has been using antihistamines, including Pepcid , taking two in the morning and two at night. He previously tried Allegra for hives but found it ineffective. Prednisone  helps alleviate his symptoms, but he has only two pills left and did not take one today to conserve them.  He has a history of high blood pressure, which is often  elevated during medical visits. He does not have a blood pressure cuff at home but acknowledges that his blood pressure is often high during medical visits.     Otherwise, there have been no changes to his past medical history, surgical history, family history, or social history.    Review of systems otherwise negative other than that mentioned in the HPI.    Objective:   Blood pressure (!) 140/80, pulse (!) 112, temperature 98.4 F (36.9 C), temperature source Temporal, resp. rate 18, height 5' 6.93" (1.7 m), weight 248 lb 9.6 oz (112.8 kg), SpO2 98%. Body mass index is 39.02 kg/m.    Physical Exam Vitals reviewed.  Constitutional:      Appearance: He is well-developed.  HENT:     Head: Normocephalic and atraumatic.     Right Ear: Tympanic membrane, ear canal and external ear normal. No drainage, swelling or tenderness. Tympanic membrane is not injected, scarred, erythematous, retracted or bulging.     Left Ear: Tympanic membrane, ear canal and external ear normal. No drainage, swelling or tenderness. Tympanic membrane is not injected, scarred, erythematous, retracted or bulging.     Nose: No nasal deformity, septal deviation, mucosal edema or rhinorrhea.     Right Turbinates: Enlarged, swollen and pale.     Left Turbinates: Enlarged, swollen and pale.     Right Sinus: No maxillary sinus tenderness or frontal sinus tenderness.     Left Sinus: No maxillary sinus tenderness or frontal sinus tenderness.     Comments: Right sided nasal polyp present obstructing around 75% of the nasal cavity.    Mouth/Throat:     Mouth: Mucous membranes are not pale and not dry.     Pharynx: Uvula midline.  Eyes:     General:        Right eye: No discharge.        Left eye: No discharge.     Conjunctiva/sclera: Conjunctivae normal.     Right eye: Right conjunctiva is not injected. No chemosis.    Left eye: Left conjunctiva is not injected. No chemosis.    Pupils: Pupils are equal, round, and  reactive to light.  Cardiovascular:     Rate and Rhythm: Normal rate and regular rhythm.     Heart sounds: Normal heart sounds.  Pulmonary:     Effort: Pulmonary effort is normal. No tachypnea, accessory muscle usage or respiratory distress.     Breath sounds: Normal breath sounds. No wheezing, rhonchi or rales.  Chest:     Chest wall: No tenderness.  Abdominal:     Tenderness: There is no abdominal tenderness. There is no guarding or rebound.  Lymphadenopathy:     Head:     Right side of head: No submandibular, tonsillar or occipital adenopathy.     Left side of head: No submandibular, tonsillar or occipital adenopathy.     Cervical: No cervical adenopathy.  Skin:    General: Skin is warm.  Capillary Refill: Capillary refill takes less than 2 seconds.     Coloration: Skin is not pale.     Findings: Rash present. No abrasion, erythema or petechiae. Rash is urticarial. Rash is not papular or vesicular.     Comments: He has some urticaria on his bilateral thighs. There are some excoriations on the arms.   Neurological:     Mental Status: He is alert.  Psychiatric:        Behavior: Behavior is cooperative.      Diagnostic studies: none  Xolair  administered in clinic today (changed dosing to every 14 days recently).     Jeffrey Gentles, MD  [facility] 

## 2024-04-11 LAB — CMP14+EGFR
ALT: 23 IU/L (ref 0–44)
AST: 17 IU/L (ref 0–40)
Albumin: 4.5 g/dL (ref 4.1–5.1)
Alkaline Phosphatase: 71 IU/L (ref 44–121)
BUN/Creatinine Ratio: 21 — ABNORMAL HIGH (ref 9–20)
BUN: 18 mg/dL (ref 6–24)
Bilirubin Total: 0.3 mg/dL (ref 0.0–1.2)
CO2: 21 mmol/L (ref 20–29)
Calcium: 9.6 mg/dL (ref 8.7–10.2)
Chloride: 100 mmol/L (ref 96–106)
Creatinine, Ser: 0.85 mg/dL (ref 0.76–1.27)
Globulin, Total: 2 g/dL (ref 1.5–4.5)
Glucose: 107 mg/dL — ABNORMAL HIGH (ref 70–99)
Potassium: 4 mmol/L (ref 3.5–5.2)
Sodium: 142 mmol/L (ref 134–144)
Total Protein: 6.5 g/dL (ref 6.0–8.5)
eGFR: 109 mL/min/{1.73_m2} (ref >59)

## 2024-04-15 ENCOUNTER — Ambulatory Visit: Payer: Self-pay | Admitting: Allergy & Immunology

## 2024-04-16 ENCOUNTER — Telehealth: Payer: Self-pay | Admitting: Allergy & Immunology

## 2024-04-16 NOTE — Telephone Encounter (Signed)
 Pt called and we adjusted 07/17 visit to later in the day; however, pt had question about possible prednisone  rx until then due to having symptoms such as swelling/welts/blisters on legs and arms (making it difficult to work). Does not work on Thursday and was hoping to speak with Dr. Idolina Maker or a nurse. Confirmed [phone] is a good callback

## 2024-04-17 MED ORDER — XOLAIR 300 MG/2ML ~~LOC~~ SOSY: 300.0000 mg | PREFILLED_SYRINGE | SUBCUTANEOUS | 11 refills | Status: AC

## 2024-04-17 NOTE — Addendum Note (Signed)
 Addended by: Evangelina Hilt on: 04/17/2024 11:49 AM   Modules accepted: Orders

## 2024-04-18 MED ORDER — PREDNISONE 10 MG PO TABS: ORAL_TABLET | ORAL | 0 refills | Status: AC

## 2024-04-18 NOTE — Telephone Encounter (Signed)
 Prednisone  has a lot of bad side effects, which we have discussed extensively. This includes weight gain, bone fractures, skin thinning, infections, and others.   I will send in a longer low dose prednisone  taper. I would prefer to try cyclosporine or we can try changing to Dupixent.  Drexel Gentles, MD [facility] 

## 2024-04-18 NOTE — Telephone Encounter (Signed)
 Jeffrey Francis informed and he is willing to discuss other options at his next office visit.

## 2024-04-24 ENCOUNTER — Ambulatory Visit

## 2024-05-01 ENCOUNTER — Ambulatory Visit

## 2024-05-01 DIAGNOSIS — L501 Idiopathic urticaria: Secondary | ICD-10-CM

## 2024-05-15 ENCOUNTER — Ambulatory Visit

## 2024-05-15 DIAGNOSIS — L501 Idiopathic urticaria: Secondary | ICD-10-CM | POA: Diagnosis not present

## 2024-05-16 ENCOUNTER — Other Ambulatory Visit: Payer: Self-pay | Admitting: Allergy & Immunology

## 2024-05-29 ENCOUNTER — Other Ambulatory Visit: Payer: Self-pay

## 2024-05-29 ENCOUNTER — Ambulatory Visit

## 2024-05-29 ENCOUNTER — Ambulatory Visit: Admitting: Allergy & Immunology

## 2024-05-29 VITALS — BP 124/82 | HR 98 | Temp 98.6°F | Resp 20

## 2024-05-29 DIAGNOSIS — L501 Idiopathic urticaria: Secondary | ICD-10-CM | POA: Diagnosis not present

## 2024-05-29 DIAGNOSIS — J330 Polyp of nasal cavity: Secondary | ICD-10-CM

## 2024-05-29 DIAGNOSIS — J310 Chronic rhinitis: Secondary | ICD-10-CM

## 2024-05-29 MED ORDER — FAMOTIDINE 20 MG PO TABS: 20.0000 mg | ORAL_TABLET | Freq: Two times a day (BID) | ORAL | 1 refills | Status: DC

## 2024-05-29 NOTE — Patient Instructions (Addendum)
 1. Chronic urticaria  - You can certainly avoid sweets if you feel that this is causing your symptoms. - We cannot do testing for sugars, so we will just have to avoid them to see if it helps.  - Read about Btk inhibitors and hives (these are hitting the market in the fall). - Continue with Xolair  every 2 weeks until the Dupixent is approved.  - Dupixent consent signed today.  - In the meantime, continue with suppressive dosing of antihistamines:   - Morning: Xyzal  (levocetirizine) 10mg  (two tablets) + Pepcid  (famotidine ) 20mg   - Evening: Zyrtec  (cetirizine ) 20mg  (two tablets) + Pepcid  (famotidine ) 20mg  - You can change this dosing at home, decreasing the dose as needed or increasing the dosing as needed.   2. Chronic rhinitis - Testing in the past showed: NEGATIVE TO THE ENTIRE PANEL - Copy of test results provided.  - Continue with: antihistamines as above  3. Follow up in 3 months or earlier if needed.    Please inform us  of any Emergency Department visits, hospitalizations, or changes in symptoms. Call us  before going to the ED for breathing or allergy  symptoms since we might be able to fit you in for a sick visit. Feel free to contact us  anytime with any questions, problems, or concerns.  It was a pleasure to see you again today!  Websites that have reliable patient information: 1. American Academy of Asthma, Allergy , and Immunology: www.aaaai.org 2. Food Allergy  Research and Education (FARE): foodallergy.org 3. Mothers of Asthmatics: http://www.asthmacommunitynetwork.org 4. American College of Allergy , Asthma, and Immunology: www.acaai.org      "Like" us  on Facebook and Instagram for our latest updates!      A healthy democracy works best when [employer] participate! Make sure you are registered to vote! If you have moved or changed any of your contact information, you will need to get this updated before voting! Scan the QR codes below to learn more!

## 2024-05-29 NOTE — Progress Notes (Signed)
 FOLLOW UP  Date of Service/Encounter:  05/29/24   Assessment:   Autoimmune urticaria - with reassuring labs (on Xolair  now at every 14 days) and suppressive antihistamines   Declined cyclosporine    Elevated ANA (1:80 titer) - consider Rheumatology referral if no improvement at the next visit   Failed Singulair and Allegra    Chronic non-allergic rhinitis   Right sided nasal polyp  Plan/Recommendations:   1. Chronic urticaria  - You can certainly avoid sweets if you feel that this is causing your symptoms. - We cannot do testing for sugars, so we will just have to avoid them to see if it helps.  - Read about Btk inhibitors and hives (these are hitting the market in the fall). - Continue with Xolair  every 2 weeks. - In the meantime, continue with suppressive dosing of antihistamines:   - Morning: Xyzal  (levocetirizine) 10mg  (two tablets) + Pepcid  (famotidine ) 20mg   - Evening: Zyrtec  (cetirizine ) 20mg  (two tablets) + Pepcid  (famotidine ) 20mg  - You can change this dosing at home, decreasing the dose as needed or increasing the dosing as needed.   2. Chronic rhinitis - Testing in the past showed: NEGATIVE TO THE ENTIRE PANEL - Copy of test results provided.  - Continue with: antihistamines as above  3. Follow up in 3 months or earlier if needed.   Subjective:   Jeffrey Francis is a 48 y.o. male presenting today for follow up of  Chief Complaint  Patient presents with   Follow-up   Urticaria    States that he does no see much change.   Pruritus    Jeffrey Francis has a history of the following: Patient Active Problem List   Diagnosis Date Noted   Recurrent ventral hernia     History obtained from: chart review and patient.  Discussed the use of AI scribe software for clinical note transcription with the patient and/or guardian, who gave verbal consent to proceed.  Jeffrey Francis is a 48 y.o. male presenting for a follow up visit.  We last saw him in May 2025.  At that  time, we decided to start cyclosporine to see if I could work with Xolair .  We continued him on Xyzal  2 tablets in the morning and Pepcid  20 mg in the morning as well as Zyrtec  2 tablets in the evening and Pepcid  20 mg in the evening.  His environmental allergy  panel was negative to the entire panel.  We were to start cyclosporine, but he actually declined on the way out.  Since last visit, he has been stable.   He experiences chronic hives with varying severity. Some days, he wakes up with hives covering his body, while other days, symptoms are minimal. The hives sometimes resolve within a few hours, but can persist throughout the day.  He suspects natural sugars, such as those in applesauce, may trigger his symptoms. He recalls an episode where consuming applesauce led to a widespread breakout by the afternoon, though this correlation is inconsistent as hives occur even without consuming fruit or natural sugars. I did tell him that we cannot test for sugars, as allergens are nearly universally (with a handful of exceptions) make to proteins rather than sugars).   He is on Xolair  injections every two weeks. He also takes Xyzal  and Pepcid  twice daily, though he is currently out of famotidine  and awaiting a refill. He uses cetirizine  as well. Despite this regimen, he has not been completely free of hives since the initial dose  of Xolair .  He uses prednisone  sparingly, typically no more than three times a week, to manage severe flare-ups, but is concerned about its decreasing effectiveness.  He has a history of frequent illnesses following COVID-19 infections, having had COVID-19 three times, but has not been sick recently. He did not receive a COVID-19 booster shot.     Otherwise, there have been no changes to his past medical history, surgical history, family history, or social history.    Review of systems otherwise negative other than that mentioned in the HPI.    Objective:   Blood  pressure 124/82, pulse 98, temperature 98.6 F (37 C), resp. rate 20, SpO2 98%. There is no height or weight on file to calculate BMI.    Physical Exam Vitals reviewed.  Constitutional:      Appearance: He is well-developed.  HENT:     Head: Normocephalic and atraumatic.     Right Ear: Tympanic membrane, ear canal and external ear normal. No drainage, swelling or tenderness. Tympanic membrane is not injected, scarred, erythematous, retracted or bulging.     Left Ear: Tympanic membrane, ear canal and external ear normal. No drainage, swelling or tenderness. Tympanic membrane is not injected, scarred, erythematous, retracted or bulging.     Nose: No nasal deformity, septal deviation, mucosal edema or rhinorrhea.     Right Turbinates: Enlarged, swollen and pale.     Left Turbinates: Enlarged, swollen and pale.     Right Sinus: No maxillary sinus tenderness or frontal sinus tenderness.     Left Sinus: No maxillary sinus tenderness or frontal sinus tenderness.     Comments: Right sided nasal polyp present obstructing around 75% of the nasal cavity.    Mouth/Throat:     Mouth: Mucous membranes are not pale and not dry.     Pharynx: Uvula midline.  Eyes:     General: Lids are normal. Allergic shiner present.        Right eye: No discharge.        Left eye: No discharge.     Conjunctiva/sclera: Conjunctivae normal.     Right eye: Right conjunctiva is not injected. No chemosis.    Left eye: Left conjunctiva is not injected. No chemosis.    Pupils: Pupils are equal, round, and reactive to light.  Cardiovascular:     Rate and Rhythm: Normal rate and regular rhythm.     Heart sounds: Normal heart sounds.  Pulmonary:     Effort: Pulmonary effort is normal. No tachypnea, accessory muscle usage or respiratory distress.     Breath sounds: Normal breath sounds. No wheezing, rhonchi or rales.     Comments: Moving air well in all lung fields. No increased work of breathing noted.  Chest:      Chest wall: No tenderness.  Abdominal:     Tenderness: There is no abdominal tenderness. There is no guarding or rebound.  Lymphadenopathy:     Head:     Right side of head: No submandibular, tonsillar or occipital adenopathy.     Left side of head: No submandibular, tonsillar or occipital adenopathy.     Cervical: No cervical adenopathy.  Skin:    General: Skin is warm.     Capillary Refill: Capillary refill takes less than 2 seconds.     Coloration: Skin is not pale.     Findings: Rash present. No abrasion, erythema or petechiae. Rash is urticarial. Rash is not papular or vesicular.     Comments: He has some  urticaria on his bilateral thighs. There are some excoriations on the arms.   Neurological:     Mental Status: He is alert.  Psychiatric:        Behavior: Behavior is cooperative.      Diagnostic studies: none     Antonino Shaggy, MD  [facility] 

## 2024-06-01 ENCOUNTER — Encounter: Payer: Self-pay | Admitting: Allergy & Immunology

## 2024-06-06 ENCOUNTER — Other Ambulatory Visit: Payer: Self-pay | Admitting: Allergy & Immunology

## 2024-06-12 ENCOUNTER — Ambulatory Visit (INDEPENDENT_AMBULATORY_CARE_PROVIDER_SITE_OTHER)

## 2024-06-12 DIAGNOSIS — L501 Idiopathic urticaria: Secondary | ICD-10-CM | POA: Diagnosis not present

## 2024-06-18 ENCOUNTER — Other Ambulatory Visit: Payer: Self-pay

## 2024-06-18 ENCOUNTER — Ambulatory Visit (INDEPENDENT_AMBULATORY_CARE_PROVIDER_SITE_OTHER): Admitting: Family

## 2024-06-18 ENCOUNTER — Encounter: Payer: Self-pay | Admitting: Family

## 2024-06-18 VITALS — BP 130/80 | HR 99 | Temp 98.3°F | Resp 18 | Wt 240.4 lb

## 2024-06-18 DIAGNOSIS — L501 Idiopathic urticaria: Secondary | ICD-10-CM

## 2024-06-18 MED ORDER — PREDNISONE 10 MG PO TABS: ORAL_TABLET | ORAL | 0 refills | Status: AC

## 2024-06-18 NOTE — Progress Notes (Signed)
 [address] [place] [address] Dept: [phone]  FOLLOW UP NOTE  Patient ID: Jeffrey Francis, male    DOB: 1976/09/13  Age: 48 y.o. MRN: [id number] Date of Office Visit: 06/18/2024  Assessment  Chief Complaint: Urticaria (This weekend - head, face, back, arms - has a flare today ( itching and burning)  last dose of prednisone  was Monday takes it as needed - last prescribed taper was 6/25) and Other (Last Xolair  injection was last week on Thursday )  HPI Jeffrey Francis is a 48 year old male who presents today for acute visit of urticaria.  He was last seen on May 29, 2024 by Dr. Iva for idiopathic urticaria, nonallergic rhinitis, and polyp of nasal cavity.  He denies any new diagnosis or surgery since his last office visit.  He reports that this week and over the last weekend  his hives  flared up and have been worse.  This weekend they were awful.  He is out of prednisone  that he usually keeps on hand.  He  hives are itching and burning and are all over.  He reports that his hives started close to a year ago now.  He continues to receive Xolair  injections 300 mg every 2 weeks and takes Xyzal  2 tablets in the morning along with Pepcid  20 mg.  In the evening he takes Zyrtec  2 tablets and Pepcid  20 mg at night.  He reports that the only time he did not have hives was after the very first shot of Xolair  and this lasted for a month.  He reports that prednisone  is the only medication that takes away his hives.  He originally thought that sugar was causing him to have hives, but he has avoided sugar and still has had hives.  He reports there are times that his upper lip will swell when he has hives.  This morning there were bags under his eyes like he had not slept in days, but this has gone down.  Over the weekend when he broke out in hives, he reports that his voice got a little bit hoarse and was fine but the next day.  He has noticed this before.  He denies any cardiorespiratory or  gastrointestinal symptoms, but mentions he always gets a little bit nauseous but mentions he is on [facility] for weight loss.  He reports that he does have an epinephrine  auto injector device and it is up to date. He is not interested in cyclosporine as an option because he spoke with his primary care physician about this medication and she was kind of against it because it opens him up for other problems such as lowering his immune system.  He reports 2 years ago he stayed constantly sick and does not want to go down this road again.  He  has not heard anything Dupixent getting approved.   Drug Allergies:  Allergies  Allergen Reactions   Amoxicillin -Pot Clavulanate Hives    Pt stated he is itching worse now    Review of Systems: Negative except as per HPI   Physical Exam: BP 130/80 (Cuff Size: Normal)   Pulse 99   Temp 98.3 F (36.8 C)   Resp 18   Wt 240 lb 6.4 oz (109 kg)   SpO2 98%   BMI 37.73 kg/m    Physical Exam Constitutional:      Appearance: Normal appearance.  HENT:     Head: Normocephalic and atraumatic.     Comments: Pharynx normal, eyes  normal, ears normal. Nose normal    Right Ear: Tympanic membrane, ear canal and external ear normal.     Left Ear: Tympanic membrane, ear canal and external ear normal.     Nose: Nose normal.     Mouth/Throat:     Mouth: Mucous membranes are moist.     Pharynx: Oropharynx is clear.  Eyes:     Conjunctiva/sclera: Conjunctivae normal.  Cardiovascular:     Rate and Rhythm: Regular rhythm.     Heart sounds: Normal heart sounds.  Pulmonary:     Effort: Pulmonary effort is normal.     Breath sounds: Normal breath sounds.     Comments: Lungs clear to auscultation Musculoskeletal:     Cervical back: Neck supple.  Skin:    General: Skin is warm.     Comments: Urticarial lesions noted on bilateral arms  Neurological:     Mental Status: He is alert and oriented to person, place, and time.  Psychiatric:        Mood and Affect:  Mood normal.        Behavior: Behavior normal.        Thought Content: Thought content normal.        Judgment: Judgment normal.     Diagnostics:  None  Assessment and Plan: 1. Idiopathic urticaria     Meds ordered this encounter  Medications   predniSONE  (DELTASONE ) 10 MG tablet    Sig: Take 2 tablets twice a day for 3 days, then on the 4th day take 2 tablets in the morning, and on the 5th day take one tablet and stop    Dispense:  15 tablet    Refill:  0    Patient Instructions  1. Chronic urticaria  - Start prednisone  10 mg take 2 tablets twice a day for 3 days, then on the 4th day take two tablets in the morning and on the 5th day take one tablet and stop.  - Consider referral to Rheumatology due to positive ANA - Read about Btk inhibitors and hives (these are hitting the market in the fall). - Continue with Xolair  every 2 weeks until the Dupixent is approved.  - Dupixent consent signed at last office visit. Message sent to [facility] biologics coordinator, to see if can be approved.  - In the meantime, continue with suppressive dosing of antihistamines:   - Morning: Xyzal  (levocetirizine) 10mg  (two tablets) + Pepcid  (famotidine ) 20mg   - Evening: Zyrtec  (cetirizine ) 20mg  (two tablets) + Pepcid  (famotidine ) 20mg  - You can change this dosing at home, decreasing the dose as needed or increasing the dosing as needed.   2. Chronic rhinitis - Testing in the past showed: NEGATIVE TO THE ENTIRE PANEL - Copy of test results provided.  - Continue with: antihistamines as above  3.  Keep already scheduled follow-up appointment on August 28, 2024 at 8:30 AM or earlier if needed.     Return in about 2 months (around 08/28/2024), or if symptoms worsen or fail to improve.    Thank you for the opportunity to care for this patient.  Please do not hesitate to contact me with questions.  Wanda Craze, FNP [facility] 

## 2024-06-18 NOTE — Patient Instructions (Addendum)
 1. Chronic urticaria  - Start prednisone  10 mg take 2 tablets twice a day for 3 days, then on the 4th day take two tablets in the morning and on the 5th day take one tablet and stop.  - Consider referral to Rheumatology due to positive ANA - Read about Btk inhibitors and hives (these are hitting the market in the fall). - Continue with Xolair  every 2 weeks until the Dupixent is approved.  - Dupixent consent signed at last office visit. Message sent to [facility] biologics coordinator, to see if can be approved.  - In the meantime, continue with suppressive dosing of antihistamines:   - Morning: Xyzal  (levocetirizine) 10mg  (two tablets) + Pepcid  (famotidine ) 20mg   - Evening: Zyrtec  (cetirizine ) 20mg  (two tablets) + Pepcid  (famotidine ) 20mg  - You can change this dosing at home, decreasing the dose as needed or increasing the dosing as needed.   2. Chronic rhinitis - Testing in the past showed: NEGATIVE TO THE ENTIRE PANEL - Copy of test results provided.  - Continue with: antihistamines as above  3.  Keep already scheduled follow-up appointment on August 28, 2024 at 8:30 AM or earlier if needed.

## 2024-07-03 ENCOUNTER — Ambulatory Visit: Admitting: Allergy

## 2024-07-03 ENCOUNTER — Other Ambulatory Visit: Payer: Self-pay

## 2024-07-03 ENCOUNTER — Other Ambulatory Visit: Payer: Self-pay | Admitting: Allergy

## 2024-07-03 ENCOUNTER — Encounter: Payer: Self-pay | Admitting: Allergy

## 2024-07-03 ENCOUNTER — Ambulatory Visit

## 2024-07-03 VITALS — BP 132/92 | HR 96 | Temp 98.6°F | Resp 16 | Ht 67.75 in | Wt 237.4 lb

## 2024-07-03 DIAGNOSIS — L501 Idiopathic urticaria: Secondary | ICD-10-CM

## 2024-07-03 MED ORDER — SULFASALAZINE 500 MG PO TABS: ORAL_TABLET | ORAL | 0 refills | Status: AC

## 2024-07-03 MED ORDER — CETIRIZINE HCL 10 MG PO TABS: ORAL_TABLET | ORAL | 5 refills | Status: AC

## 2024-07-03 MED ORDER — FAMOTIDINE 20 MG PO TABS: 20.0000 mg | ORAL_TABLET | Freq: Two times a day (BID) | ORAL | 1 refills | Status: AC

## 2024-07-03 MED ORDER — LEVOCETIRIZINE DIHYDROCHLORIDE 5 MG PO TABS: ORAL_TABLET | ORAL | 2 refills | Status: AC

## 2024-07-03 NOTE — Progress Notes (Signed)
 Follow-up Note  RE: Jeffrey Francis MRN: [id number] DOB: Mar 14, 1976 Date of Office Visit: 07/03/2024   History of present illness: Jeffrey Francis is a 48 y.o. male presenting today for acute visit for hives.  He was last seen in the office on 06/18/2024 by nurse practitioner Cheryl. Discussed the use of AI scribe software for clinical note transcription with the patient, who gave verbal consent to proceed.  He presented today for his typical Xolair  that he gets every 2 weeks in the office for hives.  When asked how he was doing prior to getting his injection he showed up with my injection room nurse his arms and she recommend that he be seen today.  He experiences persistent hives daily, which have not responded to his current treatment regimen. He is on Xolair  injections every two weeks, but the hives have not improved. He also takes a high-dose antihistamine regimen, including four tablets of an antihistamine (2 Xyzal  in the morning, 2 Zyrtec  in the evening) and Pepcid  twice a day, without alleviation of symptoms.  At his last visit due to his continued hives he was provided with a course of prednisone  which provided temporary relief, but the hives returned a day and a half later. The hives are worse in the mornings and calm down a little bit during the day. He also experiences itching, burning, swelling, and aching in his hands/joints.  He previously tried Singulair (montelukast) without benefit. He works with the public and is concerned about treatments that may weaken his immune system due to a history of frequent illnesses when his immune system is compromised. He has considered dietary changes, such as eliminating sweets but this does not help with the hive control.  He has now wondering if maybe going in is driving his symptoms and considering avoiding gluten in the diet.    Cyclosporine has been discussed however he does not want to proceed with this therapy due to the potential decrease  in immune system function and as above he does not want to weaken his immune system.  Dupixent has been discussed that he states he is awaiting approval for Dupixent to change from the Xolair .  The oral drug for hives has also been discussed however this is not yet out on the market for use yet.  Review of systems: 10pt ROS negative unless noted above in HPI  Past medical/social/surgical/family history have been reviewed and are unchanged unless specifically indicated below.  This point we should be all less than thank this is a going there after she had LP propofol  was normal.    Medication List: Current Outpatient Medications  Medication Sig Dispense Refill   cetirizine  (ZYRTEC ) 10 MG tablet Take 20 mg every evening. 60 tablet 5   EPINEPHrine  (AUVI-Q ) 0.3 mg/0.3 mL IJ SOAJ injection Inject 0.3 mg into the muscle as needed for anaphylaxis. 1 each 1   EPINEPHrine  (EPIPEN  2-PAK) 0.3 mg/0.3 mL IJ SOAJ injection Inject 0.3 mg into the muscle as needed for anaphylaxis. 0.3 mL 1   famotidine  (PEPCID ) 20 MG tablet Take 1 tablet (20 mg total) by mouth 2 (two) times daily. 180 tablet 1   ibuprofen (ADVIL,MOTRIN) 200 MG tablet Take 800 mg by mouth every 8 (eight) hours as needed (for pain.).     levocetirizine (XYZAL ) 5 MG tablet Morning take 2 tablets, Evening take 2 tablets 120 tablet 2   loratadine (CLARITIN) 10 MG tablet Take 10 mg by mouth every morning.  naproxen sodium (ALEVE) 220 MG tablet Take 660 mg by mouth 2 (two) times daily as needed (for pain.).     omalizumab  (XOLAIR ) 300 MG/2  ML prefilled syringe Inject 300 mg into the skin every 14 (fourteen) days. 4 mL 11   promethazine -dextromethorphan (PROMETHAZINE -DM) 6.25-15 MG/5ML syrup Take 5 mLs by mouth 4 (four) times daily as needed for cough. 118 mL 0   WEGOVY 0.25 MG/0.5ML SOAJ SMARTSIG:0.5 Milliliter(s) SUB-Q Once a Week     predniSONE  (DELTASONE ) 10 MG tablet Take one tablet daily for two weeks. Then take half a tablet daily for two  weeks. Then STOP. (Patient not taking: Reported on 07/03/2024) 21 tablet 0   predniSONE  (DELTASONE ) 10 MG tablet Take 2 tablets twice a day for 3 days, then on the 4th day take 2 tablets in the morning, and on the 5th day take one tablet and stop (Patient not taking: Reported on 07/03/2024) 15 tablet 0   Current Facility-Administered Medications  Medication Dose Route Frequency Provider Last Rate Last Admin   omalizumab  (XOLAIR ) prefilled syringe 300 mg  300 mg Subcutaneous Q28 days Gallagher, Joel Louis, MD   300 mg at 07/03/24 9093     Known medication allergies: Allergies  Allergen Reactions   Amoxicillin -Pot Clavulanate Hives    Pt stated he is itching worse now     Focused physical examination: Blood pressure (!) 132/92, pulse 96, temperature 98.6 F (37 C), temperature source Temporal, resp. rate 16, height 5' 7.75 (1.721 m), weight 237 lb 6.4 oz (107.7 kg), SpO2 100%.  General: Alert, interactive, in no acute distress. Skin: Scattered erythematous urticarial type lesions primarily located arms bilaterally, legs bilaterally, abdomen -generalized , nonvesicular. Extremities:  No clubbing, cyanosis or edema. Neuro:   Grossly intact.  Diagnostics/Labs: None today  Assessment and plan:   Chronic urticaria  - Consider referral to Rheumatology due to positive ANA - Read about Btk inhibitors and hives (these are hitting the market in the fall). - Continue with Xolair  every 2 weeks until the Dupixent is approved.  - Dupixent consent signed already. - In the meantime, continue with suppressive dosing of antihistamines:   - Morning: Xyzal  (levocetirizine) 10mg  (two tablets) + Pepcid  (famotidine ) 20mg   - Evening: Zyrtec  (cetirizine ) 20mg  (two tablets) + Pepcid  (famotidine ) 20mg  - Start Sulfasalazine  500mg  1 tab twice a day for first week.  If you tolerate this without GI side effects then increase week 2 to 2 tab (1000mg ) twice a day.   This is hopefully temporary medication to help  calm down your hives while await Dupixent approval and/or starting new oral drug this fall  Chronic rhinitis - Testing in the past showed: NEGATIVE TO THE ENTIRE PANEL - Copy of test results provided.  - Continue with: antihistamines as above  Keep already scheduled follow-up appointment on August 28, 2024 at 8:30 AM or earlier if needed.    I appreciate the opportunity to take part in [place] care. Please do not hesitate to contact me with questions.  Sincerely,   Danita Brain, MD Allergy /Immunology [facility]

## 2024-07-03 NOTE — Patient Instructions (Addendum)
 Chronic urticaria  - Consider referral to Rheumatology due to positive ANA - Read about Btk inhibitors and hives (these are hitting the market in the fall). - Continue with Xolair  every 2 weeks until the Dupixent is approved.  - Dupixent consent signed already. - In the meantime, continue with suppressive dosing of antihistamines:   - Morning: Xyzal  (levocetirizine) 10mg  (two tablets) + Pepcid  (famotidine ) 20mg   - Evening: Zyrtec  (cetirizine ) 20mg  (two tablets) + Pepcid  (famotidine ) 20mg  - Start Sulfasalazine  500mg  1 tab twice a day for first week.  If you tolerate this without GI side effects then increase week 2 to 2 tab (1000mg ) twice a day.   This is hopefully temporary medication to help calm down your hives while await Dupixent approval and/or starting new oral drug this fall  Chronic rhinitis - Testing in the past showed: NEGATIVE TO THE ENTIRE PANEL - Copy of test results provided.  - Continue with: antihistamines as above  Keep already scheduled follow-up appointment on August 28, 2024 at 8:30 AM or earlier if needed.

## 2024-07-04 ENCOUNTER — Other Ambulatory Visit (HOSPITAL_COMMUNITY): Payer: Self-pay

## 2024-07-04 ENCOUNTER — Telehealth: Payer: Self-pay

## 2024-07-04 NOTE — Telephone Encounter (Signed)
*  AA  Pharmacy Patient Advocate Encounter  Received notification from [facility] that Prior Authorization for Cetirizine  HCl 10MG  tablets has been CANCELLED due to Per plan:  Your patient will pay 100% of a discounted price for this medication. Any amount the patient pays will not apply to their deductible or out-of-pocket expenses

## 2024-07-08 NOTE — Telephone Encounter (Signed)
 Pt is going to get zyrtec  over the counter.

## 2024-07-12 ENCOUNTER — Telehealth: Payer: Self-pay | Admitting: *Deleted

## 2024-07-12 MED ORDER — DUPIXENT 300 MG/2ML ~~LOC~~ SOSY: 600.0000 mg | PREFILLED_SYRINGE | Freq: Once | SUBCUTANEOUS | 11 refills | Status: AC

## 2024-07-12 NOTE — Telephone Encounter (Signed)
-----   Message from Fabio Morton Shaggy sent at 07/08/2024  3:29 PM EDT ----- Sorry - he likes to [facility].Thank you for taking good care of him! ----- Message ----- From: Cheryl Reusing, FNP Sent: 06/18/2024  11:24 AM EDT To: Jarren Morton Shaggy, MD; Madelin HERO [address]  Have we heard anything about insurance approval for Dupixent  (chronic urticaria)? Currently on Xolair  300 mg every 14 days.

## 2024-07-12 NOTE — Telephone Encounter (Signed)
 Spoke to patient and advised I got denial and approval from his Ins and even when I called they could not advise status. Will go ahead and send in new rx and copay card enrolled and reach out to patient once delivery set to make appt to start therapy

## 2024-07-16 NOTE — Telephone Encounter (Signed)
 Denial and approval? This is confusing!

## 2024-07-17 ENCOUNTER — Ambulatory Visit (INDEPENDENT_AMBULATORY_CARE_PROVIDER_SITE_OTHER)

## 2024-07-17 DIAGNOSIS — L501 Idiopathic urticaria: Secondary | ICD-10-CM | POA: Diagnosis not present

## 2024-07-31 ENCOUNTER — Ambulatory Visit (INDEPENDENT_AMBULATORY_CARE_PROVIDER_SITE_OTHER)

## 2024-07-31 DIAGNOSIS — L501 Idiopathic urticaria: Secondary | ICD-10-CM

## 2024-08-14 ENCOUNTER — Ambulatory Visit (INDEPENDENT_AMBULATORY_CARE_PROVIDER_SITE_OTHER)

## 2024-08-14 DIAGNOSIS — L501 Idiopathic urticaria: Secondary | ICD-10-CM | POA: Diagnosis not present

## 2024-08-20 ENCOUNTER — Telehealth: Payer: Self-pay | Admitting: *Deleted

## 2024-08-20 NOTE — Telephone Encounter (Signed)
 Sounds good

## 2024-08-20 NOTE — Telephone Encounter (Signed)
 Called patient regarding Rhapsido. Patient at this time wants to try the Dupixent  recently approved for his CSU symptoms and if failure to same will move onto next therapy

## 2024-08-27 NOTE — Patient Instructions (Incomplete)
 Chronic urticaria Continue Xyzal  5 mg-take 2 tablets in the morning and 2 tablets in the evening Continue famotidine  20 mg twice a day Continue Dupixent  injection once every 2 weeks. If no improvement will move to a BTK inhibitor  Chronic rhinitis Continue antihistamines as listed above Consider saline nasal rinses as needed for nasal symptoms. Use this before any medicated nasal sprays for best result  Abnormal lab result Consider a referral to rheumatology for elevated ANA.  Call the clinic if this treatment plan is not working well for you.  Follow up in 2 months or sooner if needed.

## 2024-08-27 NOTE — Progress Notes (Signed)
   [address] Jeffrey Francis Dept: [phone]  FOLLOW UP NOTE  Patient ID: Jeffrey Francis, male    DOB: 1976-06-10  Age: 48 y.o. MRN: [id number] Date of Office Visit: 08/28/2024  Assessment  Chief Complaint: No chief complaint on file.  HPI Jeffrey Francis is a 48 year old male who presents to the clinic for follow-up visit.  He was last seen in this clinic on 07/03/2024 by Dr. Jeneal for evaluation of chronic rhinitis and chronic urticaria.  At his last visit, he continued levocetirizine to 5 mg tablets twice a day, famotidine  20 mg twice a day, and has started sulfasalazine  500 mg twice a day with instructions to increase to 1000 mg twice a day if tolerated.    Lab results on 12/20/2023 indicate elevated chronic urticaria panel.  His last environmental allergy  skin testing on 12/27/2023 was negative to the environmental panel.  Discussed the use of AI scribe software for clinical note transcription with the patient, who gave verbal consent to proceed.  History of Present Illness      Drug Allergies:  Allergies  Allergen Reactions   Amoxicillin -Pot Clavulanate Hives    Pt stated he is itching worse now    Physical Exam: There were no vitals taken for this visit.   Physical Exam  Diagnostics:    Assessment and Plan: No diagnosis found.  No orders of the defined types were placed in this encounter.   There are no Patient Instructions on file for this visit.  No follow-ups on file.    Thank you for the opportunity to care for this patient.  Please do not hesitate to contact me with questions.  Arlean Mutter, FNP [facility]     '

## 2024-08-28 ENCOUNTER — Ambulatory Visit (INDEPENDENT_AMBULATORY_CARE_PROVIDER_SITE_OTHER): Admitting: Family Medicine

## 2024-08-28 ENCOUNTER — Other Ambulatory Visit: Payer: Self-pay

## 2024-08-28 ENCOUNTER — Ambulatory Visit

## 2024-08-28 ENCOUNTER — Encounter: Payer: Self-pay | Admitting: Family Medicine

## 2024-08-28 VITALS — BP 124/86 | HR 98 | Temp 98.7°F

## 2024-08-28 DIAGNOSIS — L501 Idiopathic urticaria: Secondary | ICD-10-CM | POA: Diagnosis not present

## 2024-08-28 DIAGNOSIS — L508 Other urticaria: Secondary | ICD-10-CM

## 2024-08-28 DIAGNOSIS — R7689 Other specified abnormal immunological findings in serum: Secondary | ICD-10-CM

## 2024-08-28 MED ORDER — DUPILUMAB 300 MG/2ML ~~LOC~~ SOSY
300.0000 mg | PREFILLED_SYRINGE | SUBCUTANEOUS | Status: AC
  Administered 2024-08-28 – 2024-12-18 (×8): 300 mg via SUBCUTANEOUS

## 2024-08-28 NOTE — Progress Notes (Signed)
 Immunotherapy   Patient Details  Name: JADAN HINOJOS MRN: [id number] Date of Birth: Apr 09, 1976  08/28/2024  [name] started injections for  Dupixent  Following schedule: Every fourteen days.  Frequency:Every two weeks.  Epi-Pen:Not needed.  Consent signed previously and patient instructions given. Patient sat in room twenty six for over fifteen minutes without an issue.    Santana DELENA Eck 08/28/2024, 8:36 AM

## 2024-09-11 ENCOUNTER — Ambulatory Visit (INDEPENDENT_AMBULATORY_CARE_PROVIDER_SITE_OTHER)

## 2024-09-11 DIAGNOSIS — L501 Idiopathic urticaria: Secondary | ICD-10-CM

## 2024-09-12 ENCOUNTER — Ambulatory Visit

## 2024-09-25 ENCOUNTER — Ambulatory Visit

## 2024-09-25 DIAGNOSIS — L501 Idiopathic urticaria: Secondary | ICD-10-CM | POA: Diagnosis not present

## 2024-10-16 ENCOUNTER — Ambulatory Visit (INDEPENDENT_AMBULATORY_CARE_PROVIDER_SITE_OTHER)

## 2024-10-16 DIAGNOSIS — L501 Idiopathic urticaria: Secondary | ICD-10-CM | POA: Diagnosis not present

## 2024-10-30 ENCOUNTER — Ambulatory Visit: Admitting: Allergy & Immunology

## 2024-10-30 ENCOUNTER — Encounter: Payer: Self-pay | Admitting: Allergy & Immunology

## 2024-10-30 ENCOUNTER — Other Ambulatory Visit: Payer: Self-pay

## 2024-10-30 ENCOUNTER — Ambulatory Visit

## 2024-10-30 VITALS — BP 130/98 | HR 98 | Temp 98.4°F | Resp 16 | Ht 68.0 in | Wt 232.0 lb

## 2024-10-30 DIAGNOSIS — L501 Idiopathic urticaria: Secondary | ICD-10-CM

## 2024-10-30 DIAGNOSIS — R7689 Other specified abnormal immunological findings in serum: Secondary | ICD-10-CM

## 2024-10-30 DIAGNOSIS — J330 Polyp of nasal cavity: Secondary | ICD-10-CM

## 2024-10-30 DIAGNOSIS — M25522 Pain in left elbow: Secondary | ICD-10-CM | POA: Diagnosis not present

## 2024-10-30 DIAGNOSIS — J310 Chronic rhinitis: Secondary | ICD-10-CM | POA: Diagnosis not present

## 2024-10-30 DIAGNOSIS — M25422 Effusion, left elbow: Secondary | ICD-10-CM

## 2024-10-30 NOTE — Patient Instructions (Addendum)
 Chronic urticaria Continue Xyzal  5 mg-take 2 tablets in the morning and 2 tablets in the evening Continue famotidine  20 mg twice a day Continue Dupixent  injection once every 2 weeks.  Samples of Rhapsido provided today to help with hives (one month sample provided). Take one tablet twice daily.   Chronic rhinitis Continue antihistamines as listed above Consider saline nasal rinses as needed for nasal symptoms. Use this before any medicated nasal sprays for best result  Abnormal lab result We put in a rheumatology for elevated ANA.  Return in about 2 months (around 12/31/2024). You can have the follow up appointment with Dr. Iva or a Nurse Practicioner (our Nurse Practitioners are excellent and always have Physician oversight!).    Please inform us  of any Emergency Department visits, hospitalizations, or changes in symptoms. Call us  before going to the ED for breathing or allergy  symptoms since we might be able to fit you in for a sick visit. Feel free to contact us  anytime with any questions, problems, or concerns.  It was a pleasure to see you again today!  Websites that have reliable patient information: 1. American Academy of Asthma, Allergy , and Immunology: www.aaaai.org 2. Food Allergy  Research and Education (FARE): foodallergy.org 3. Mothers of Asthmatics: http://www.asthmacommunitynetwork.org 4. American College of Allergy , Asthma, and Immunology: www.acaai.org      Like us  on [employer] and Instagram for our latest updates!      A healthy democracy works best when [employer] participate! Make sure you are registered to vote! If you have moved or changed any of your contact information, you will need to get this updated before voting! Scan the QR codes below to learn more!

## 2024-10-30 NOTE — Progress Notes (Signed)
 FOLLOW UP  Date of Service/Encounter:  10/30/2024   Assessment:   Autoimmune urticaria - with reassuring labs (on Xolair  now at every 14 days) and suppressive antihistamines, changing to Rhapsido  today (samples provided)   Declined cyclosporine    Elevated ANA (1:80 titer) - placed rheumatology referral    Failed Singulair and Allegra    Chronic non-allergic rhinitis   Right sided nasal polyp  Plan/Recommendations:   Chronic urticaria Continue Xyzal  5 mg-take 2 tablets in the morning and 2 tablets in the evening Continue famotidine  20 mg twice a day Continue Dupixent  injection once every 2 weeks.  Samples of Rhapsido  provided today to help with hives (one month sample provided). Take one tablet twice daily.   Chronic rhinitis Continue antihistamines as listed above Consider saline nasal rinses as needed for nasal symptoms. Use this before any medicated nasal sprays for best result  Abnormal lab result We put in a rheumatology for elevated ANA.  Return in about 2 months (around 12/31/2024). You can have the follow up appointment with Dr. Iva or a Nurse Practicioner (our Nurse Practitioners are excellent and always have Physician oversight!).   Subjective:   Jeffrey Francis is a 48 y.o. male presenting today for follow up of  Chief Complaint  Patient presents with   Follow-up    He says his hives is still there.     Jeffrey Francis has a history of the following: Patient Active Problem List   Diagnosis Date Noted   Recurrent ventral hernia     History obtained from: chart review and patient.  Discussed the use of AI scribe software for clinical note transcription with the patient and/or guardian, who gave verbal consent to proceed.  Jeffrey Francis is a 48 y.o. male presenting for a follow up visit. He was last seen in October 2025. At that time, we continued with levocetirizine as well as famotidine  20mg  twice daily.  He also remained on Dupixent  every 2 weeks.   There was talk about changing him to the BTK inhibitor.  For his rhinitis, we continue with antihistamines as well as nasal saline rinses.  He had an elevated ANA and that rheumatology referral was made.  Since last visit, he has been about the same.  The Dupixent  has been more helpful than the Xolair .  He is due for Dupixent  today.  He continues to experience hives, primarily in the morning, with recent occurrences on his legs. The hives tend to subside a day or two after receiving his Dupixent  injection but gradually return as the effect of the injection wears off. He has not yet received his current scheduled injection.  He has a history of using Xolair , which initially provided relief from hives, but the effect diminished over time. He transitioned to Dupixent , which has been somewhat effective, though he still experiences hives. He recalls a period when he was covered in hives and had to leave work due to severe symptoms, describing his skin as feeling 'like it was on fire'.  He has tried various treatments, including a large dose of sulfasalazine , which did not alleviate his symptoms.  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 This pain has persisted for the past week and is described as similar to 'tennis elbow'.  He is interested in seeing rheumatology given this new finding of joint pain.  He has a history of diabetes and is not fond  of needles, which affects his willingness to undergo blood work. Hives primarily occur on his legs and occasionally on his arms, according to the patient's report.     Otherwise, there have been no changes to his past medical history, surgical history, family history, or social history.    Review of systems otherwise negative other than that mentioned in the HPI.    Objective:   Blood pressure (!) 130/98, pulse 98, temperature 98.4 F (36.9 C),  temperature source Temporal, resp. rate 16, height 5' 8 (1.727 m), weight 232 lb (105.2 kg), SpO2 99%. Body mass index is 35.28 kg/m.    Physical Exam Vitals reviewed.  Constitutional:      Appearance: He is well-developed.  HENT:     Head: Normocephalic and atraumatic.     Right Ear: Tympanic membrane, ear canal and external ear normal. No drainage, swelling or tenderness. Tympanic membrane is not injected, scarred, erythematous, retracted or bulging.     Left Ear: Tympanic membrane, ear canal and external ear normal. No drainage, swelling or tenderness. Tympanic membrane is not injected, scarred, erythematous, retracted or bulging.     Nose: No nasal deformity, septal deviation, mucosal edema or rhinorrhea.     Right Turbinates: Enlarged, swollen and pale.     Left Turbinates: Enlarged, swollen and pale.     Right Sinus: No maxillary sinus tenderness or frontal sinus tenderness.     Left Sinus: No maxillary sinus tenderness or frontal sinus tenderness.     Comments: Right sided nasal polyp present obstructing around 75% of the nasal cavity.    Mouth/Throat:     Mouth: Mucous membranes are not pale and not dry.     Pharynx: Uvula midline.  Eyes:     General: Lids are normal. Allergic shiner present.        Right eye: No discharge.        Left eye: No discharge.     Conjunctiva/sclera: Conjunctivae normal.     Right eye: Right conjunctiva is not injected. No chemosis.    Left eye: Left conjunctiva is not injected. No chemosis.    Pupils: Pupils are equal, round, and reactive to light.  Cardiovascular:     Rate and Rhythm: Normal rate and regular rhythm.     Heart sounds: Normal heart sounds.  Pulmonary:     Effort: Pulmonary effort is normal. No tachypnea, accessory muscle usage or respiratory distress.     Breath sounds: Normal breath sounds. No wheezing, rhonchi or rales.     Comments: Moving air well in all lung fields. No increased work of breathing noted.  Chest:      Chest wall: No tenderness.  Abdominal:     Tenderness: There is no abdominal tenderness. There is no guarding or rebound.  Lymphadenopathy:     Head:     Right side of head: No submandibular, tonsillar or occipital adenopathy.     Left side of head: No submandibular, tonsillar or occipital adenopathy.     Cervical: No cervical adenopathy.  Skin:    General: Skin is warm.     Capillary Refill: Capillary refill takes less than 2 seconds.     Coloration: Skin is not pale.     Findings: Rash present. No abrasion, erythema or petechiae. Rash is urticarial. Rash is not papular or vesicular.     Comments: Skin looks much better today.  Arms are clear.  He does have some urticaria on his thighs.  Neurological:  Mental Status: He is alert.  Psychiatric:        Behavior: Behavior is cooperative.      Diagnostic studies: none     Tilton Shaggy, MD  [facility] 

## 2024-10-31 ENCOUNTER — Telehealth: Payer: Self-pay | Admitting: *Deleted

## 2024-10-31 MED ORDER — RHAPSIDO 25 MG PO TABS: 25.0000 mg | ORAL_TABLET | Freq: Two times a day (BID) | ORAL | 11 refills | Status: AC

## 2024-10-31 NOTE — Telephone Encounter (Signed)
 Called patient and advised approval, copay card and submit for Rhapsido  to [facility]

## 2024-10-31 NOTE — Telephone Encounter (Signed)
-----   Message from Posey Shaggy, MD sent at 10/30/2024  2:26 PM EST ----- RHAPSIDO

## 2024-11-03 NOTE — Telephone Encounter (Signed)
 Thanks, Tam Tam!   [name], MD [facility]

## 2024-11-17 ENCOUNTER — Ambulatory Visit

## 2024-11-20 ENCOUNTER — Ambulatory Visit (INDEPENDENT_AMBULATORY_CARE_PROVIDER_SITE_OTHER)

## 2024-11-20 DIAGNOSIS — L501 Idiopathic urticaria: Secondary | ICD-10-CM | POA: Diagnosis not present

## 2024-12-03 ENCOUNTER — Telehealth: Payer: Self-pay | Admitting: Allergy & Immunology

## 2024-12-03 NOTE — Telephone Encounter (Signed)
 PT called stating that at his last office visit it was mentioned that the medication Remibrutinib  (RHAPSIDO ) 25 MG TABS [[id number]] was going to be sent to the pharmacy and it was never sent and he did not heard anything else about it.The pharmacy that he would like the script sent to is [facility] on [address] [place], [place]

## 2024-12-03 NOTE — Telephone Encounter (Signed)
 Tammy takes care of ordering that. Routing to her.

## 2024-12-04 ENCOUNTER — Ambulatory Visit (INDEPENDENT_AMBULATORY_CARE_PROVIDER_SITE_OTHER)

## 2024-12-04 DIAGNOSIS — L501 Idiopathic urticaria: Secondary | ICD-10-CM

## 2024-12-18 ENCOUNTER — Ambulatory Visit

## 2024-12-18 DIAGNOSIS — L501 Idiopathic urticaria: Secondary | ICD-10-CM

## 2025-01-01 ENCOUNTER — Ambulatory Visit

## 2025-01-22 ENCOUNTER — Ambulatory Visit: Admitting: Allergy & Immunology

## 2025-02-12 ENCOUNTER — Ambulatory Visit
